# Patient Record
Sex: Female | Born: 1980 | Race: Black or African American | Hispanic: No | Smoking: Never smoker
Health system: Southern US, Community
[De-identification: ages and names within clinical notes are randomized; demographics above are authoritative.]

## PROBLEM LIST (undated history)

## (undated) DIAGNOSIS — R7611 Nonspecific reaction to tuberculin skin test without active tuberculosis: Secondary | ICD-10-CM

## (undated) DIAGNOSIS — K219 Gastro-esophageal reflux disease without esophagitis: Secondary | ICD-10-CM

## (undated) HISTORY — DX: Nonspecific reaction to tuberculin skin test without active tuberculosis: R76.11

## (undated) HISTORY — PX: THERAPEUTIC ABORTION: SHX798

---

## 2012-04-17 NOTE — L&D Delivery Note (Signed)
Delivery Note At 12:46 PM a viable female was delivered via Vaginal, Spontaneous Delivery (Presentation: Left Occiput Anterior).  APGAR: 8, 9; weight 6 lb 5.8 oz (2886 g).   Placenta status: Intact, Spontaneous.  Cord: 3 vessels with the following complications: None.    Anesthesia: Epidural  Episiotomy: None Lacerations: none Suture Repair: na Est. Blood Loss (mL): 350cc  Mom to postpartum.  Baby to Couplet care / Skin to Skin.  Called to room for pt who was c/c/+2 and pushing. HR had dropped to the 60s and was not coming up easily. Preserved scalp stim. She pushed both during and after contraction with good effort to deliver a liveborn female with spontaneous cry.  Baby placed on Mom for skin to skin. Delayed cord clamping performed and cord then cut, clamped and cut by FOB. Cord pH collected but not run by lab. Placenta delivered spontaneously intact with 3V cord. No tears or complications.  Mom to mother baby and baby on skin to skin.   Ettie Krontz L 03/03/2013, 1:11 PM

## 2012-07-16 DIAGNOSIS — R7611 Nonspecific reaction to tuberculin skin test without active tuberculosis: Secondary | ICD-10-CM

## 2012-07-16 HISTORY — DX: Nonspecific reaction to tuberculin skin test without active tuberculosis: R76.11

## 2013-01-16 ENCOUNTER — Encounter: Payer: Self-pay | Admitting: *Deleted

## 2013-01-20 ENCOUNTER — Ambulatory Visit (INDEPENDENT_AMBULATORY_CARE_PROVIDER_SITE_OTHER): Payer: Managed Care, Other (non HMO) | Admitting: Advanced Practice Midwife

## 2013-01-20 ENCOUNTER — Encounter: Payer: Self-pay | Admitting: Advanced Practice Midwife

## 2013-01-20 VITALS — BP 98/74 | Temp 98.2°F | Ht 66.0 in | Wt 187.0 lb

## 2013-01-20 DIAGNOSIS — O365990 Maternal care for other known or suspected poor fetal growth, unspecified trimester, not applicable or unspecified: Secondary | ICD-10-CM

## 2013-01-20 DIAGNOSIS — O26879 Cervical shortening, unspecified trimester: Secondary | ICD-10-CM

## 2013-01-20 DIAGNOSIS — O26873 Cervical shortening, third trimester: Secondary | ICD-10-CM | POA: Insufficient documentation

## 2013-01-20 DIAGNOSIS — IMO0002 Reserved for concepts with insufficient information to code with codable children: Secondary | ICD-10-CM

## 2013-01-20 LAB — POCT URINALYSIS DIP (DEVICE)
Glucose, UA: NEGATIVE mg/dL
Ketones, ur: NEGATIVE mg/dL
Nitrite: NEGATIVE
Protein, ur: NEGATIVE mg/dL
Urobilinogen, UA: 0.2 mg/dL (ref 0.0–1.0)

## 2013-01-20 NOTE — Progress Notes (Signed)
Pulse: 92

## 2013-01-23 ENCOUNTER — Telehealth: Payer: Self-pay | Admitting: *Deleted

## 2013-01-23 DIAGNOSIS — O365931 Maternal care for other known or suspected poor fetal growth, third trimester, fetus 1: Secondary | ICD-10-CM

## 2013-01-23 DIAGNOSIS — O365990 Maternal care for other known or suspected poor fetal growth, unspecified trimester, not applicable or unspecified: Secondary | ICD-10-CM | POA: Insufficient documentation

## 2013-01-23 NOTE — Telephone Encounter (Signed)
Called pt and informed her of need for follow up US to assess fetal growth and consult for plan of care @ [facility]aternal [facility]etal Care.  Pt voiced understanding and agreed to appt on 10/15 @ 1000. Directions given to [facility] dept.

## 2013-01-23 NOTE — Patient Instructions (Signed)

## 2013-01-23 NOTE — Progress Notes (Signed)
New OB transfer from [place].  Records reviewed.  Primary problems have been short cervix (was as low as 1.6cm then later up to 2.5cm) and New IUGR (5th %).  Will start twice weekly testing. Refer to MFM for [place] and recommendations

## 2013-01-23 NOTE — Telephone Encounter (Signed)
Message copied by Jill Side on Thu Jan 23, 2013  8:26 AM ------      Message from: [place], [place] L      Created: Thu Jan 23, 2013  4:59 AM      Regarding: need to start twice weekly testing       Just reviewed charts again. New IUGR at 28 wks (now 33 wks)      I put in a referral to MFM for [place] and recommendations            Can you call to get her appt asap?            Thanks      marie ------

## 2013-01-27 ENCOUNTER — Other Ambulatory Visit: Payer: Self-pay | Admitting: Obstetrics and Gynecology

## 2013-01-27 ENCOUNTER — Encounter: Payer: Self-pay | Admitting: Obstetrics & Gynecology

## 2013-01-27 DIAGNOSIS — O26879 Cervical shortening, unspecified trimester: Secondary | ICD-10-CM

## 2013-01-28 ENCOUNTER — Ambulatory Visit (HOSPITAL_COMMUNITY)
Admission: RE | Admit: 2013-01-28 | Discharge: 2013-01-28 | Disposition: A | Discharge status: Home / Self Care | Payer: Managed Care, Other (non HMO) | Source: Ambulatory Visit | Attending: Obstetrics and Gynecology | Admitting: Obstetrics and Gynecology

## 2013-01-28 ENCOUNTER — Other Ambulatory Visit: Payer: Self-pay | Admitting: Obstetrics and Gynecology

## 2013-01-28 ENCOUNTER — Encounter (HOSPITAL_COMMUNITY): Payer: Managed Care, Other (non HMO)

## 2013-01-28 ENCOUNTER — Other Ambulatory Visit (HOSPITAL_COMMUNITY): Payer: Managed Care, Other (non HMO)

## 2013-01-28 VITALS — BP 107/59 | HR 98 | Wt 185.0 lb

## 2013-01-28 DIAGNOSIS — O26879 Cervical shortening, unspecified trimester: Secondary | ICD-10-CM

## 2013-01-28 DIAGNOSIS — O365990 Maternal care for other known or suspected poor fetal growth, unspecified trimester, not applicable or unspecified: Secondary | ICD-10-CM | POA: Insufficient documentation

## 2013-01-28 DIAGNOSIS — IMO0002 Reserved for concepts with insufficient information to code with codable children: Secondary | ICD-10-CM

## 2013-01-28 DIAGNOSIS — O26873 Cervical shortening, third trimester: Secondary | ICD-10-CM

## 2013-01-28 NOTE — Consult Note (Addendum)
MFM Consult, Note:  I spoke to your patient, Kelly Krueger, in regard to "IUGR", vaginal bleeding history, and short cervix in pregnancy.  Currently, she has a SIUP at 34 3/7 wks complicated by 3 episodes of vaginal bleeding/spotting, lagging growth, and a short cervix.  Today's ultrasound demonstrates EFW is at the 23rd percentile with AC at the 5th percentile and symmetric growth pattern by HC/AC 1.02.  The UA Doppler S/D is near the mean for gestational age (normal).  AFI is gestational age appropriate.  I explained to her that these parameters are not consistent with a diagnosis of IUGR.  She reports that she had vaginal bleeding/spotting at 27, 29, and [redacted] weeks gestation.  She reports having at most passing a small clot, demonstrating with her hand an approximate 3cm diameter back at around [redacted] weeks gestation.  She has had no bleeding since.  Today's ultrasound demonstrates an anteriorly implanted placenta without evidence of previa.  She has no contractions and has no clinical indication today to warrant endovaginal imaging.  I recommend patient continue routine daily activities at home but that she present for evaluation in case of vaginal bleeding, uterine contractions, leakage of fluid or decreased fetal movement.  In my opinion, the bleeding history while resolved warrants continued antenatal testing with twice weekly NST, weekly AFI, monthly interval growth, and fetal kick counts.  Today's BPP of 8/8 is reassuring for fetal well-being.  Impressions: 1. Active SIUP with AGA growth by EFW and HC/AC 2. Antenatal vaginal bleeding resolved  Summary of Recommendations: Given the history of vaginal bleeding in this pregnancy, I recommend antenatal testing and fetal growth surveillance as follows:  1. twice weekly NST  2. weekly AFI 3. follow up growth in 3-4 weeks 4. fetal kick counts 5. preterm labor/vaginal bleeding precautions 6. No indication for tocolytics, antenatal steroids, or TVUS  for cervical length at this gestational age, noting I recommend serial pelvic examination if warranted by symptoms 7. Provided testing, interval growth, and maternal status remain reassuring, I would consider offering induction at 39-[redacted] weeks gestation for this patient.  Time Spent: I spent in excess of 40 minutes in consultation with this patient to review records, evaluate her case, and provide her with an adequate discussion and education.  More than 50% of this time was spent in direct face-to-face counseling. It was a pleasure seeing your patient in the office today.  Thank you for consultation. Please do not hesitate to contact our service for any further questions.   Level III  Olene Craven, Louann Sjogren, MD, MS, FACOG Assistant Professor Section of Maternal-Fetal Medicine [facility]

## 2013-01-29 ENCOUNTER — Encounter (HOSPITAL_COMMUNITY): Payer: Managed Care, Other (non HMO)

## 2013-01-29 ENCOUNTER — Other Ambulatory Visit (HOSPITAL_COMMUNITY): Payer: Managed Care, Other (non HMO)

## 2013-01-30 ENCOUNTER — Other Ambulatory Visit: Payer: Self-pay | Admitting: Obstetrics and Gynecology

## 2013-01-30 DIAGNOSIS — O365931 Maternal care for other known or suspected poor fetal growth, third trimester, fetus 1: Secondary | ICD-10-CM

## 2013-01-31 ENCOUNTER — Ambulatory Visit (HOSPITAL_COMMUNITY)
Admission: RE | Admit: 2013-01-31 | Discharge: 2013-01-31 | Disposition: A | Discharge status: Home / Self Care | Payer: Managed Care, Other (non HMO) | Source: Ambulatory Visit | Attending: Obstetrics and Gynecology | Admitting: Obstetrics and Gynecology

## 2013-01-31 VITALS — BP 116/73 | Wt 187.0 lb

## 2013-01-31 DIAGNOSIS — O26879 Cervical shortening, unspecified trimester: Secondary | ICD-10-CM | POA: Insufficient documentation

## 2013-01-31 DIAGNOSIS — O365990 Maternal care for other known or suspected poor fetal growth, unspecified trimester, not applicable or unspecified: Secondary | ICD-10-CM | POA: Insufficient documentation

## 2013-01-31 DIAGNOSIS — O26873 Cervical shortening, third trimester: Secondary | ICD-10-CM

## 2013-02-03 ENCOUNTER — Ambulatory Visit (INDEPENDENT_AMBULATORY_CARE_PROVIDER_SITE_OTHER): Payer: Managed Care, Other (non HMO) | Admitting: Obstetrics and Gynecology

## 2013-02-03 VITALS — BP 107/71 | Temp 97.1°F | Wt 187.2 lb

## 2013-02-03 DIAGNOSIS — O4690 Antepartum hemorrhage, unspecified, unspecified trimester: Secondary | ICD-10-CM | POA: Insufficient documentation

## 2013-02-03 DIAGNOSIS — O26873 Cervical shortening, third trimester: Secondary | ICD-10-CM

## 2013-02-03 DIAGNOSIS — O4693 Antepartum hemorrhage, unspecified, third trimester: Secondary | ICD-10-CM

## 2013-02-03 LAB — POCT URINALYSIS DIP (DEVICE)
Ketones, ur: NEGATIVE mg/dL
Nitrite: NEGATIVE
Protein, ur: NEGATIVE mg/dL
Urobilinogen, UA: 0.2 mg/dL (ref 0.0–1.0)
pH: 6.5 (ref 5.0–8.0)

## 2013-02-03 NOTE — Progress Notes (Signed)
Reviewed MFM scan> 23rd, sym, normal. 2x/wk FATs (last episode over 1 month ago). Kick counts

## 2013-02-03 NOTE — Patient Instructions (Signed)
Fetal Movement Counts Patient Name: __________________________________________________ Patient Due Date: ____________________ Performing a fetal movement count is highly recommended in high-risk pregnancies, but it is good for every pregnant woman to do. Your caregiver may ask you to start counting fetal movements at 28 weeks of the pregnancy. Fetal movements often increase:  After eating a full meal.  After physical activity.  After eating or drinking something sweet or cold.  At rest. Pay attention to when you feel the baby is most active. This will help you notice a pattern of your baby's sleep and wake cycles and what factors contribute to an increase in fetal movement. It is important to perform a fetal movement count at the same time each day when your baby is normally most active.  HOW TO COUNT FETAL MOVEMENTS 1. Find a quiet and comfortable area to sit or lie down on your left side. Lying on your left side provides the best blood and oxygen circulation to your baby. 2. Write down the day and time on a sheet of paper or in a journal. 3. Start counting kicks, flutters, swishes, rolls, or jabs in a 2 hour period. You should feel at least 10 movements within 2 hours. 4. If you do not feel 10 movements in 2 hours, wait 2 3 hours and count again. Look for a change in the pattern or not enough counts in 2 hours. SEEK MEDICAL CARE IF:  You feel less than 10 counts in 2 hours, tried twice.  There is no movement in over an hour.  The pattern is changing or taking longer each day to reach 10 counts in 2 hours.  You feel the baby is not moving as he or she usually does. Date: ____________ Movements: ____________ Start time: ____________ Finish time: ____________  Date: ____________ Movements: ____________ Start time: ____________ Finish time: ____________ Date: ____________ Movements: ____________ Start time: ____________ Finish time: ____________ Date: ____________ Movements: ____________  Start time: ____________ Finish time: ____________ Date: ____________ Movements: ____________ Start time: ____________ Finish time: ____________ Date: ____________ Movements: ____________ Start time: ____________ Finish time: ____________ Date: ____________ Movements: ____________ Start time: ____________ Finish time: ____________ Date: ____________ Movements: ____________ Start time: ____________ Finish time: ____________  Date: ____________ Movements: ____________ Start time: ____________ Finish time: ____________ Date: ____________ Movements: ____________ Start time: ____________ Finish time: ____________ Date: ____________ Movements: ____________ Start time: ____________ Finish time: ____________ Date: ____________ Movements: ____________ Start time: ____________ Finish time: ____________ Date: ____________ Movements: ____________ Start time: ____________ Finish time: ____________ Date: ____________ Movements: ____________ Start time: ____________ Finish time: ____________ Date: ____________ Movements: ____________ Start time: ____________ Finish time: ____________  Date: ____________ Movements: ____________ Start time: ____________ Finish time: ____________ Date: ____________ Movements: ____________ Start time: ____________ Finish time: ____________ Date: ____________ Movements: ____________ Start time: ____________ Finish time: ____________ Date: ____________ Movements: ____________ Start time: ____________ Finish time: ____________ Date: ____________ Movements: ____________ Start time: ____________ Finish time: ____________ Date: ____________ Movements: ____________ Start time: ____________ Finish time: ____________ Date: ____________ Movements: ____________ Start time: ____________ Finish time: ____________  Date: ____________ Movements: ____________ Start time: ____________ Finish time: ____________ Date: ____________ Movements: ____________ Start time: ____________ Finish time:  ____________ Date: ____________ Movements: ____________ Start time: ____________ Finish time: ____________ Date: ____________ Movements: ____________ Start time: ____________ Finish time: ____________ Date: ____________ Movements: ____________ Start time: ____________ Finish time: ____________ Date: ____________ Movements: ____________ Start time: ____________ Finish time: ____________ Date: ____________ Movements: ____________ Start time: ____________ Finish time: ____________  Date: ____________ Movements: ____________ Start time: ____________ Finish   time: ____________ Date: ____________ Movements: ____________ Start time: ____________ Finish time: ____________ Date: ____________ Movements: ____________ Start time: ____________ Finish time: ____________ Date: ____________ Movements: ____________ Start time: ____________ Finish time: ____________ Date: ____________ Movements: ____________ Start time: ____________ Finish time: ____________ Date: ____________ Movements: ____________ Start time: ____________ Finish time: ____________ Date: ____________ Movements: ____________ Start time: ____________ Finish time: ____________  Date: ____________ Movements: ____________ Start time: ____________ Finish time: ____________ Date: ____________ Movements: ____________ Start time: ____________ Finish time: ____________ Date: ____________ Movements: ____________ Start time: ____________ Finish time: ____________ Date: ____________ Movements: ____________ Start time: ____________ Finish time: ____________ Date: ____________ Movements: ____________ Start time: ____________ Finish time: ____________ Date: ____________ Movements: ____________ Start time: ____________ Finish time: ____________ Date: ____________ Movements: ____________ Start time: ____________ Finish time: ____________  Date: ____________ Movements: ____________ Start time: ____________ Finish time: ____________ Date: ____________ Movements:  ____________ Start time: ____________ Finish time: ____________ Date: ____________ Movements: ____________ Start time: ____________ Finish time: ____________ Date: ____________ Movements: ____________ Start time: ____________ Finish time: ____________ Date: ____________ Movements: ____________ Start time: ____________ Finish time: ____________ Date: ____________ Movements: ____________ Start time: ____________ Finish time: ____________ Date: ____________ Movements: ____________ Start time: ____________ Finish time: ____________  Date: ____________ Movements: ____________ Start time: ____________ Finish time: ____________ Date: ____________ Movements: ____________ Start time: ____________ Finish time: ____________ Date: ____________ Movements: ____________ Start time: ____________ Finish time: ____________ Date: ____________ Movements: ____________ Start time: ____________ Finish time: ____________ Date: ____________ Movements: ____________ Start time: ____________ Finish time: ____________ Date: ____________ Movements: ____________ Start time: ____________ Finish time: ____________ Document Released: 05/03/2006 Document Revised: 03/20/2012 Document Reviewed: 01/29/2012 [employer] Patient Information 2014 [employer]  

## 2013-02-03 NOTE — Progress Notes (Signed)
Pulse- 98 Patient reports pain in LLQ

## 2013-02-04 ENCOUNTER — Other Ambulatory Visit: Payer: Self-pay | Admitting: Obstetrics and Gynecology

## 2013-02-04 ENCOUNTER — Ambulatory Visit (HOSPITAL_COMMUNITY)
Admission: RE | Admit: 2013-02-04 | Discharge: 2013-02-04 | Disposition: A | Discharge status: Home / Self Care | Payer: Managed Care, Other (non HMO) | Source: Ambulatory Visit | Attending: Obstetrics and Gynecology | Admitting: Obstetrics and Gynecology

## 2013-02-04 VITALS — BP 112/65 | HR 97 | Wt 188.5 lb

## 2013-02-04 DIAGNOSIS — O26873 Cervical shortening, third trimester: Secondary | ICD-10-CM

## 2013-02-04 DIAGNOSIS — O365931 Maternal care for other known or suspected poor fetal growth, third trimester, fetus 1: Secondary | ICD-10-CM

## 2013-02-04 DIAGNOSIS — O26879 Cervical shortening, unspecified trimester: Secondary | ICD-10-CM | POA: Insufficient documentation

## 2013-02-04 DIAGNOSIS — O289 Unspecified abnormal findings on antenatal screening of mother: Secondary | ICD-10-CM | POA: Insufficient documentation

## 2013-02-04 DIAGNOSIS — O365990 Maternal care for other known or suspected poor fetal growth, unspecified trimester, not applicable or unspecified: Secondary | ICD-10-CM | POA: Insufficient documentation

## 2013-02-04 NOTE — Progress Notes (Signed)
Kelly Krueger was seen for ultrasound appointment today.  Please see [facility] report for details.

## 2013-02-07 ENCOUNTER — Ambulatory Visit (HOSPITAL_COMMUNITY)
Admission: RE | Admit: 2013-02-07 | Discharge: 2013-02-07 | Disposition: A | Discharge status: Home / Self Care | Payer: Managed Care, Other (non HMO) | Source: Ambulatory Visit | Attending: Obstetrics & Gynecology | Admitting: Obstetrics & Gynecology

## 2013-02-07 ENCOUNTER — Other Ambulatory Visit: Payer: Self-pay | Admitting: Obstetrics & Gynecology

## 2013-02-07 ENCOUNTER — Ambulatory Visit (INDEPENDENT_AMBULATORY_CARE_PROVIDER_SITE_OTHER): Payer: Managed Care, Other (non HMO) | Admitting: *Deleted

## 2013-02-07 ENCOUNTER — Other Ambulatory Visit (HOSPITAL_COMMUNITY): Payer: Managed Care, Other (non HMO)

## 2013-02-07 VITALS — BP 98/67 | Temp 98.1°F | Wt 185.5 lb

## 2013-02-07 DIAGNOSIS — O289 Unspecified abnormal findings on antenatal screening of mother: Secondary | ICD-10-CM

## 2013-02-07 DIAGNOSIS — O288 Other abnormal findings on antenatal screening of mother: Secondary | ICD-10-CM

## 2013-02-07 DIAGNOSIS — O365930 Maternal care for other known or suspected poor fetal growth, third trimester, not applicable or unspecified: Secondary | ICD-10-CM

## 2013-02-07 DIAGNOSIS — O365990 Maternal care for other known or suspected poor fetal growth, unspecified trimester, not applicable or unspecified: Secondary | ICD-10-CM

## 2013-02-07 NOTE — Progress Notes (Signed)
P - 74 

## 2013-02-10 ENCOUNTER — Encounter: Payer: Self-pay | Admitting: Obstetrics and Gynecology

## 2013-02-10 ENCOUNTER — Ambulatory Visit (INDEPENDENT_AMBULATORY_CARE_PROVIDER_SITE_OTHER): Payer: Managed Care, Other (non HMO) | Admitting: Obstetrics and Gynecology

## 2013-02-10 ENCOUNTER — Encounter: Payer: Managed Care, Other (non HMO) | Admitting: Obstetrics and Gynecology

## 2013-02-10 VITALS — BP 107/75 | Wt 188.2 lb

## 2013-02-10 DIAGNOSIS — O26879 Cervical shortening, unspecified trimester: Secondary | ICD-10-CM

## 2013-02-10 DIAGNOSIS — O365990 Maternal care for other known or suspected poor fetal growth, unspecified trimester, not applicable or unspecified: Secondary | ICD-10-CM

## 2013-02-10 DIAGNOSIS — O26873 Cervical shortening, third trimester: Secondary | ICD-10-CM

## 2013-02-10 DIAGNOSIS — O365931 Maternal care for other known or suspected poor fetal growth, third trimester, fetus 1: Secondary | ICD-10-CM

## 2013-02-10 DIAGNOSIS — O365930 Maternal care for other known or suspected poor fetal growth, third trimester, not applicable or unspecified: Secondary | ICD-10-CM

## 2013-02-10 LAB — OB RESULTS CONSOLE GC/CHLAMYDIA
Chlamydia: NEGATIVE
Gonorrhea: NEGATIVE

## 2013-02-10 LAB — OB RESULTS CONSOLE GBS: GBS: POSITIVE

## 2013-02-10 LAB — POCT URINALYSIS DIP (DEVICE)
Bilirubin Urine: NEGATIVE
Glucose, UA: NEGATIVE mg/dL
Nitrite: NEGATIVE
Protein, ur: NEGATIVE mg/dL
Urobilinogen, UA: 0.2 mg/dL (ref 0.0–1.0)
pH: 6 (ref 5.0–8.0)

## 2013-02-10 NOTE — Progress Notes (Signed)
P-120 

## 2013-02-10 NOTE — Progress Notes (Signed)
NST reviewed and reactive. FM/labor precautions reviewed. Cultures today. Will schedule follow up growth ultrasound for next week.

## 2013-02-11 ENCOUNTER — Ambulatory Visit (HOSPITAL_COMMUNITY): Payer: Managed Care, Other (non HMO)

## 2013-02-11 LAB — GC/CHLAMYDIA PROBE AMP: GC Probe RNA: NEGATIVE

## 2013-02-11 NOTE — Progress Notes (Signed)
Reactive NST 02/07/13

## 2013-02-14 ENCOUNTER — Ambulatory Visit (INDEPENDENT_AMBULATORY_CARE_PROVIDER_SITE_OTHER): Payer: Managed Care, Other (non HMO) | Admitting: *Deleted

## 2013-02-14 ENCOUNTER — Encounter: Payer: Self-pay | Admitting: Obstetrics and Gynecology

## 2013-02-14 ENCOUNTER — Ambulatory Visit (HOSPITAL_COMMUNITY): Payer: Managed Care, Other (non HMO)

## 2013-02-14 VITALS — BP 98/67

## 2013-02-14 DIAGNOSIS — O365931 Maternal care for other known or suspected poor fetal growth, third trimester, fetus 1: Secondary | ICD-10-CM

## 2013-02-14 DIAGNOSIS — O365990 Maternal care for other known or suspected poor fetal growth, unspecified trimester, not applicable or unspecified: Secondary | ICD-10-CM

## 2013-02-14 NOTE — Progress Notes (Signed)
NST performed today was reviewed and was found to be reactive.  Continue recommended antenatal testing and prenatal care.  

## 2013-02-14 NOTE — Progress Notes (Signed)
P - 104 

## 2013-02-15 ENCOUNTER — Encounter: Payer: Self-pay | Admitting: Obstetrics and Gynecology

## 2013-02-15 DIAGNOSIS — O99820 Streptococcus B carrier state complicating pregnancy: Secondary | ICD-10-CM | POA: Insufficient documentation

## 2013-02-17 ENCOUNTER — Ambulatory Visit (INDEPENDENT_AMBULATORY_CARE_PROVIDER_SITE_OTHER): Payer: Managed Care, Other (non HMO) | Admitting: Obstetrics & Gynecology

## 2013-02-17 VITALS — BP 103/72 | Wt 190.7 lb

## 2013-02-17 DIAGNOSIS — O99820 Streptococcus B carrier state complicating pregnancy: Secondary | ICD-10-CM

## 2013-02-17 DIAGNOSIS — O365931 Maternal care for other known or suspected poor fetal growth, third trimester, fetus 1: Secondary | ICD-10-CM

## 2013-02-17 DIAGNOSIS — O26879 Cervical shortening, unspecified trimester: Secondary | ICD-10-CM

## 2013-02-17 DIAGNOSIS — O26873 Cervical shortening, third trimester: Secondary | ICD-10-CM

## 2013-02-17 DIAGNOSIS — O09899 Supervision of other high risk pregnancies, unspecified trimester: Secondary | ICD-10-CM

## 2013-02-17 DIAGNOSIS — O365990 Maternal care for other known or suspected poor fetal growth, unspecified trimester, not applicable or unspecified: Secondary | ICD-10-CM

## 2013-02-17 DIAGNOSIS — Z22330 Carrier of Group B streptococcus: Secondary | ICD-10-CM

## 2013-02-17 LAB — FETAL NONSTRESS TEST

## 2013-02-17 LAB — POCT URINALYSIS DIP (DEVICE)
Bilirubin Urine: NEGATIVE
Glucose, UA: NEGATIVE mg/dL
Ketones, ur: NEGATIVE mg/dL
Specific Gravity, Urine: 1.02 (ref 1.005–1.030)
pH: 6.5 (ref 5.0–8.0)

## 2013-02-17 NOTE — Patient Instructions (Signed)
Return to clinic for any obstetric concerns or go to [facility] for evaluation  

## 2013-02-17 NOTE — Progress Notes (Signed)
[place] for growth scheduled on 11/7

## 2013-02-17 NOTE — Progress Notes (Signed)
Will follow up growth scan.  NST performed today was reviewed and was found to be reactive.  Continue recommended antenatal testing and prenatal care. No other complaints or concerns.  Fetal movement and labor precautions reviewed.

## 2013-02-17 NOTE — Progress Notes (Signed)
Pulse: 90

## 2013-02-20 ENCOUNTER — Other Ambulatory Visit: Payer: Self-pay

## 2013-02-21 ENCOUNTER — Other Ambulatory Visit: Payer: Managed Care, Other (non HMO)

## 2013-02-21 ENCOUNTER — Ambulatory Visit (HOSPITAL_COMMUNITY)
Admission: RE | Admit: 2013-02-21 | Discharge: 2013-02-21 | Disposition: A | Discharge status: Home / Self Care | Payer: Managed Care, Other (non HMO) | Source: Ambulatory Visit | Attending: Obstetrics and Gynecology | Admitting: Obstetrics and Gynecology

## 2013-02-21 ENCOUNTER — Ambulatory Visit (HOSPITAL_COMMUNITY): Admission: RE | Admit: 2013-02-21 | Payer: Managed Care, Other (non HMO) | Source: Ambulatory Visit

## 2013-02-21 DIAGNOSIS — O26879 Cervical shortening, unspecified trimester: Secondary | ICD-10-CM | POA: Insufficient documentation

## 2013-02-21 DIAGNOSIS — O365990 Maternal care for other known or suspected poor fetal growth, unspecified trimester, not applicable or unspecified: Secondary | ICD-10-CM | POA: Insufficient documentation

## 2013-02-21 DIAGNOSIS — O26873 Cervical shortening, third trimester: Secondary | ICD-10-CM

## 2013-02-24 ENCOUNTER — Encounter: Payer: Self-pay | Admitting: Family Medicine

## 2013-02-24 ENCOUNTER — Ambulatory Visit (INDEPENDENT_AMBULATORY_CARE_PROVIDER_SITE_OTHER): Payer: Managed Care, Other (non HMO) | Admitting: Family Medicine

## 2013-02-24 VITALS — BP 108/77 | Wt 190.3 lb

## 2013-02-24 DIAGNOSIS — O365931 Maternal care for other known or suspected poor fetal growth, third trimester, fetus 1: Secondary | ICD-10-CM

## 2013-02-24 DIAGNOSIS — Z22330 Carrier of Group B streptococcus: Secondary | ICD-10-CM

## 2013-02-24 DIAGNOSIS — O99820 Streptococcus B carrier state complicating pregnancy: Secondary | ICD-10-CM

## 2013-02-24 DIAGNOSIS — O26873 Cervical shortening, third trimester: Secondary | ICD-10-CM

## 2013-02-24 DIAGNOSIS — O09899 Supervision of other high risk pregnancies, unspecified trimester: Secondary | ICD-10-CM

## 2013-02-24 DIAGNOSIS — N83202 Unspecified ovarian cyst, left side: Secondary | ICD-10-CM | POA: Insufficient documentation

## 2013-02-24 DIAGNOSIS — N83209 Unspecified ovarian cyst, unspecified side: Secondary | ICD-10-CM

## 2013-02-24 DIAGNOSIS — O365990 Maternal care for other known or suspected poor fetal growth, unspecified trimester, not applicable or unspecified: Secondary | ICD-10-CM

## 2013-02-24 DIAGNOSIS — O26879 Cervical shortening, unspecified trimester: Secondary | ICD-10-CM

## 2013-02-24 DIAGNOSIS — R7611 Nonspecific reaction to tuberculin skin test without active tuberculosis: Secondary | ICD-10-CM | POA: Insufficient documentation

## 2013-02-24 LAB — POCT URINALYSIS DIP (DEVICE)
Glucose, UA: NEGATIVE mg/dL
Ketones, ur: NEGATIVE mg/dL
Protein, ur: NEGATIVE mg/dL
Specific Gravity, Urine: 1.02 (ref 1.005–1.030)
pH: 7 (ref 5.0–8.0)

## 2013-02-24 NOTE — Patient Instructions (Addendum)
Third Trimester of Pregnancy The third trimester is from week 29 through week 42, months 7 through 9. The third trimester is a time when the fetus is growing rapidly. At the end of the ninth month, the fetus is about 20 inches in length and weighs 6 10 pounds.  BODY CHANGES Your body goes through many changes during pregnancy. The changes vary from woman to woman.   Your weight will continue to increase. You can expect to gain 25 35 pounds (11 16 kg) by the end of the pregnancy.  You may begin to get stretch marks on your hips, abdomen, and breasts.  You may urinate more often because the fetus is moving lower into your pelvis and pressing on your bladder.  You may develop or continue to have heartburn as a result of your pregnancy.  You may develop constipation because certain hormones are causing the muscles that push waste through your intestines to slow down.  You may develop hemorrhoids or swollen, bulging veins (varicose veins).  You may have pelvic pain because of the weight gain and pregnancy hormones relaxing your joints between the bones in your pelvis. Back aches may result from over exertion of the muscles supporting your posture.  Your breasts will continue to grow and be tender. A yellow discharge may leak from your breasts called colostrum.  Your belly button may stick out.  You may feel short of breath because of your expanding uterus.  You may notice the fetus "dropping," or moving lower in your abdomen.  You may have a bloody mucus discharge. This usually occurs a few days to a week before labor begins.  Your cervix becomes thin and soft (effaced) near your due date. WHAT TO EXPECT AT YOUR PRENATAL EXAMS  You will have prenatal exams every 2 weeks until week 36. Then, you will have weekly prenatal exams. During a routine prenatal visit:  You will be weighed to make sure you and the fetus are growing normally.  Your blood pressure is taken.  Your abdomen will  be measured to track your baby's growth.  The fetal heartbeat will be listened to.  Any test results from the previous visit will be discussed.  You may have a cervical check near your due date to see if you have effaced. At around 36 weeks, your caregiver will check your cervix. At the same time, your caregiver will also perform a test on the secretions of the vaginal tissue. This test is to determine if a type of bacteria, Group B streptococcus, is present. Your caregiver will explain this further. Your caregiver may ask you:  What your birth plan is.  How you are feeling.  If you are feeling the baby move.  If you have had any abnormal symptoms, such as leaking fluid, bleeding, severe headaches, or abdominal cramping.  If you have any questions. Other tests or screenings that may be performed during your third trimester include:  Blood tests that check for low iron levels (anemia).  Fetal testing to check the health, activity level, and growth of the fetus. Testing is done if you have certain medical conditions or if there are problems during the pregnancy. FALSE LABOR You may feel small, irregular contractions that eventually go away. These are called Braxton Hicks contractions, or false labor. Contractions may last for hours, days, or even weeks before true labor sets in. If contractions come at regular intervals, intensify, or become painful, it is best to be seen by your caregiver.    SIGNS OF LABOR   Menstrual-like cramps.  Contractions that are 5 minutes apart or less.  Contractions that start on the top of the uterus and spread down to the lower abdomen and back.  A sense of increased pelvic pressure or back pain.  A watery or bloody mucus discharge that comes from the vagina. If you have any of these signs before the 37th week of pregnancy, call your caregiver right away. You need to go to the hospital to get checked immediately. HOME CARE INSTRUCTIONS   Avoid all  smoking, herbs, alcohol, and unprescribed drugs. These chemicals affect the formation and growth of the baby.  Follow your caregiver's instructions regarding medicine use. There are medicines that are either safe or unsafe to take during pregnancy.  Exercise only as directed by your caregiver. Experiencing uterine cramps is a good sign to stop exercising.  Continue to eat regular, healthy meals.  Wear a good support bra for breast tenderness.  Do not use hot tubs, steam rooms, or saunas.  Wear your seat belt at all times when driving.  Avoid raw meat, uncooked cheese, cat litter boxes, and soil used by cats. These carry germs that can cause birth defects in the baby.  Take your prenatal vitamins.  Try taking a stool softener (if your caregiver approves) if you develop constipation. Eat more high-fiber foods, such as fresh vegetables or fruit and whole grains. Drink plenty of fluids to keep your urine clear or pale yellow.  Take warm sitz baths to soothe any pain or discomfort caused by hemorrhoids. Use hemorrhoid cream if your caregiver approves.  If you develop varicose veins, wear support hose. Elevate your feet for 15 minutes, 3 4 times a day. Limit salt in your diet.  Avoid heavy lifting, wear low heal shoes, and practice good posture.  Rest a lot with your legs elevated if you have leg cramps or low back pain.  Visit your dentist if you have not gone during your pregnancy. Use a soft toothbrush to brush your teeth and be gentle when you floss.  A sexual relationship may be continued unless your caregiver directs you otherwise.  Do not travel far distances unless it is absolutely necessary and only with the approval of your caregiver.  Take prenatal classes to understand, practice, and ask questions about the labor and delivery.  Make a trial run to the hospital.  Pack your hospital bag.  Prepare the baby's nursery.  Continue to go to all your prenatal visits as directed  by your caregiver. SEEK MEDICAL CARE IF:  You are unsure if you are in labor or if your water has broken.  You have dizziness.  You have mild pelvic cramps, pelvic pressure, or nagging pain in your abdominal area.  You have persistent nausea, vomiting, or diarrhea.  You have a bad smelling vaginal discharge.  You have pain with urination. SEEK IMMEDIATE MEDICAL CARE IF:   You have a fever.  You are leaking fluid from your vagina.  You have spotting or bleeding from your vagina.  You have severe abdominal cramping or pain.  You have rapid weight loss or gain.  You have shortness of breath with chest pain.  You notice sudden or extreme swelling of your face, hands, ankles, feet, or legs.  You have not felt your baby move in over an hour.  You have severe headaches that do not go away with medicine.  You have vision changes. Document Released: 03/28/2001 Document Revised: 12/04/2012 Document Reviewed:   06/04/2012 [employer] Patient Information 2014 [employer].  Breastfeeding Deciding to breastfeed is one of the best choices you can make for you and your baby. A change in hormones during pregnancy causes your breast tissue to grow and increases the number and size of your milk ducts. These hormones also allow proteins, sugars, and fats from your blood supply to make breast milk in your milk-producing glands. Hormones prevent breast milk from being released before your baby is born as well as prompt milk flow after birth. Once breastfeeding has begun, thoughts of your baby, as well as his or her sucking or crying, can stimulate the release of milk from your milk-producing glands.  BENEFITS OF BREASTFEEDING For Your Baby  Your first milk (colostrum) helps your baby's digestive system function better.   There are antibodies in your milk that help your baby fight off infections.   Your baby has a lower incidence of asthma, allergies, and sudden infant death syndrome.    The nutrients in breast milk are better for your baby than infant formulas and are designed uniquely for your baby's needs.   Breast milk improves your baby's brain development.   Your baby is less likely to develop other conditions, such as childhood obesity, asthma, or type 2 diabetes mellitus.  For You   Breastfeeding helps to create a very special bond between you and your baby.   Breastfeeding is convenient. Breast milk is always available at the correct temperature and costs nothing.   Breastfeeding helps to burn calories and helps you lose the weight gained during pregnancy.   Breastfeeding makes your uterus contract to its prepregnancy size faster and slows bleeding (lochia) after you give birth.   Breastfeeding helps to lower your risk of developing type 2 diabetes mellitus, osteoporosis, and breast or ovarian cancer later in life. SIGNS THAT YOUR BABY IS HUNGRY Early Signs of Hunger  Increased alertness or activity.  Stretching.  Movement of the head from side to side.  Movement of the head and opening of the mouth when the corner of the mouth or cheek is stroked (rooting).  Increased sucking sounds, smacking lips, cooing, sighing, or squeaking.  Hand-to-mouth movements.  Increased sucking of fingers or hands. Late Signs of Hunger  Fussing.  Intermittent crying. Extreme Signs of Hunger Signs of extreme hunger will require calming and consoling before your baby will be able to breastfeed successfully. Do not wait for the following signs of extreme hunger to occur before you initiate breastfeeding:   Restlessness.  A loud, strong cry.   Screaming. BREASTFEEDING BASICS Breastfeeding Initiation  Find a comfortable place to sit or lie down, with your neck and back well supported.  Place a pillow or rolled up blanket under your baby to bring him or her to the level of your breast (if you are seated). Nursing pillows are specially designed to help  support your arms and your baby while you breastfeed.  Make sure that your baby's abdomen is facing your abdomen.   Gently massage your breast. With your fingertips, massage from your chest wall toward your nipple in a circular motion. This encourages milk flow. You may need to continue this action during the feeding if your milk flows slowly.  Support your breast with 4 fingers underneath and your thumb above your nipple. Make sure your fingers are well away from your nipple and your baby's mouth.   Stroke your baby's lips gently with your finger or nipple.   When your baby's mouth is   open wide enough, quickly bring your baby to your breast, placing your entire nipple and as much of the colored area around your nipple (areola) as possible into your baby's mouth.   More areola should be visible above your baby's upper lip than below the lower lip.   Your baby's tongue should be between his or her lower gum and your breast.   Ensure that your baby's mouth is correctly positioned around your nipple (latched). Your baby's lips should create a seal on your breast and be turned out (everted).  It is common for your baby to suck about 2 3 minutes in order to start the flow of breast milk. Latching Teaching your baby how to latch on to your breast properly is very important. An improper latch can cause nipple pain and decreased milk supply for you and poor weight gain in your baby. Also, if your baby is not latched onto your nipple properly, he or she may swallow some air during feeding. This can make your baby fussy. Burping your baby when you switch breasts during the feeding can help to get rid of the air. However, teaching your baby to latch on properly is still the best way to prevent fussiness from swallowing air while breastfeeding. Signs that your baby has successfully latched on to your nipple:    Silent tugging or silent sucking, without causing you pain.   Swallowing heard  between every 3 4 sucks.    Muscle movement above and in front of his or her ears while sucking.  Signs that your baby has not successfully latched on to nipple:   Sucking sounds or smacking sounds from your baby while breastfeeding.  Nipple pain. If you think your baby has not latched on correctly, slip your finger into the corner of your baby's mouth to break the suction and place it between your baby's gums. Attempt breastfeeding initiation again. Signs of Successful Breastfeeding Signs from your baby:   A gradual decrease in the number of sucks or complete cessation of sucking.   Falling asleep.   Relaxation of his or her body.   Retention of a small amount of milk in his or her mouth.   Letting go of your breast by himself or herself. Signs from you:  Breasts that have increased in firmness, weight, and size 1 3 hours after feeding.   Breasts that are softer immediately after breastfeeding.  Increased milk volume, as well as a change in milk consistency and color by the 5th day of breastfeeding.   Nipples that are not sore, cracked, or bleeding. Signs That Your Baby is Getting Enough Milk  Wetting at least 3 diapers in a 24-hour period. The urine should be clear and pale yellow by age 5 days.  At least 3 stools in a 24-hour period by age 5 days. The stool should be soft and yellow.  At least 3 stools in a 24-hour period by age 7 days. The stool should be seedy and yellow.  No loss of weight greater than 10% of birth weight during the first 3 days of age.  Average weight gain of 4 7 ounces (120 210 mL) per week after age 4 days.  Consistent daily weight gain by age 5 days, without weight loss after the age of 2 weeks. After a feeding, your baby may spit up a small amount. This is common. BREASTFEEDING FREQUENCY AND DURATION Frequent feeding will help you make more milk and can prevent sore nipples and breast engorgement.   Breastfeed when you feel the need to  reduce the fullness of your breasts or when your baby shows signs of hunger. This is called "breastfeeding on demand." Avoid introducing a pacifier to your baby while you are working to establish breastfeeding (the first 4 6 weeks after your baby is born). After this time you may choose to use a pacifier. Research has shown that pacifier use during the first year of a baby's life decreases the risk of sudden infant death syndrome (SIDS). Allow your baby to feed on each breast as long as he or she wants. Breastfeed until your baby is finished feeding. When your baby unlatches or falls asleep while feeding from the first breast, offer the second breast. Because newborns are often sleepy in the first few weeks of life, you may need to awaken your baby to get him or her to feed. Breastfeeding times will vary from baby to baby. However, the following rules can serve as a guide to help you ensure that your baby is properly fed:  Newborns (babies 4 weeks of age or younger) may breastfeed every 1 3 hours.  Newborns should not go longer than 3 hours during the day or 5 hours during the night without breastfeeding.  You should breastfeed your baby a minimum of 8 times in a 24-hour period until you begin to introduce solid foods to your baby at around 6 months of age. BREAST MILK PUMPING Pumping and storing breast milk allows you to ensure that your baby is exclusively fed your breast milk, even at times when you are unable to breastfeed. This is especially important if you are going back to work while you are still breastfeeding or when you are not able to be present during feedings. Your lactation consultant can give you guidelines on how long it is safe to store breast milk.  A breast pump is a machine that allows you to pump milk from your breast into a sterile bottle. The pumped breast milk can then be stored in a refrigerator or freezer. Some breast pumps are operated by hand, while others use electricity. Ask  your lactation consultant which type will work best for you. Breast pumps can be purchased, but some hospitals and breastfeeding support groups lease breast pumps on a monthly basis. A lactation consultant can teach you how to hand express breast milk, if you prefer not to use a pump.  CARING FOR YOUR BREASTS WHILE YOU BREASTFEED Nipples can become dry, cracked, and sore while breastfeeding. The following recommendations can help keep your breasts moisturized and healthy:  Avoid using soap on your nipples.   Wear a supportive bra. Although not required, special nursing bras and tank tops are designed to allow access to your breasts for breastfeeding without taking off your entire bra or top. Avoid wearing underwire style bras or extremely tight bras.  Air dry your nipples for 3 4minutes after each feeding.   Use only cotton bra pads to absorb leaked breast milk. Leaking of breast milk between feedings is normal.   Use lanolin on your nipples after breastfeeding. Lanolin helps to maintain your skin's normal moisture barrier. If you use pure lanolin you do not need to wash it off before feeding your baby again. Pure lanolin is not toxic to your baby. You may also hand express a few drops of breast milk and gently massage that milk into your nipples and allow the milk to air dry. In the first few weeks after giving birth, some women   experience extremely full breasts (engorgement). Engorgement can make your breasts feel heavy, warm, and tender to the touch. Engorgement peaks within 3 5 days after you give birth. The following recommendations can help ease engorgement:  Completely empty your breasts while breastfeeding or pumping. You may want to start by applying warm, moist heat (in the shower or with warm water-soaked hand towels) just before feeding or pumping. This increases circulation and helps the milk flow. If your baby does not completely empty your breasts while breastfeeding, pump any extra  milk after he or she is finished.  Wear a snug bra (nursing or regular) or tank top for 1 2 days to signal your body to slightly decrease milk production.  Apply ice packs to your breasts, unless this is too uncomfortable for you.  Make sure that your baby is latched on and positioned properly while breastfeeding. If engorgement persists after 48 hours of following these recommendations, contact your health care provider or a Advertising copywriter. OVERALL HEALTH CARE RECOMMENDATIONS WHILE BREASTFEEDING  Eat healthy foods. Alternate between meals and snacks, eating 3 of each per day. Because what you eat affects your breast milk, some of the foods may make your baby more irritable than usual. Avoid eating these foods if you are sure that they are negatively affecting your baby.  Drink milk, fruit juice, and water to satisfy your thirst (about 10 glasses a day).   Rest often, relax, and continue to take your prenatal vitamins to prevent fatigue, stress, and anemia.  Continue breast self-awareness checks.  Avoid chewing and smoking tobacco.  Avoid alcohol and drug use. Some medicines that may be harmful to your baby can pass through breast milk. It is important to ask your health care provider before taking any medicine, including all over-the-counter and prescription medicine as well as vitamin and herbal supplements. It is possible to become pregnant while breastfeeding. If birth control is desired, ask your health care provider about options that will be safe for your baby. SEEK MEDICAL CARE IF:   You feel like you want to stop breastfeeding or have become frustrated with breastfeeding.  You have painful breasts or nipples.  Your nipples are cracked or bleeding.  Your breasts are red, tender, or warm.  You have a swollen area on either breast.  You have a fever or chills.  You have nausea or vomiting.  You have drainage other than breast milk from your nipples.  Your breasts  do not become full before feedings by the 5th day after you give birth.  You feel sad and depressed.  Your baby is too sleepy to eat well.  Your baby is having trouble sleeping.   Your baby is wetting less than 3 diapers in a 24-hour period.  Your baby has less than 3 stools in a 24-hour period.  Your baby's skin or the white part of his or her eyes becomes yellow.   Your baby is not gaining weight by 76 days of age. SEEK IMMEDIATE MEDICAL CARE IF:   Your baby is overly tired (lethargic) and does not want to wake up and feed.  Your baby develops an unexplained fever. Document Released: 04/03/2005 Document Revised: 12/04/2012 Document Reviewed: 09/25/2012 [facility] Patient Information 2014 [place], [place].    Vaginal Delivery During delivery, your health care provider will help you give birth to your baby. During a vaginal delivery, you will work to push the baby out of your vagina. However, before you can push your baby out, a few  things need to happen. The opening of your uterus (cervix) has to soften, thin out, and open up (dilate) all the way to 10 cm. Also, your baby has to move down from the uterus into your vagina.  SIGNS OF LABOR  Your health care provider will first need to make sure you are in labor. Signs of labor include:   Passing what is called the mucous plug before labor begins. This is a small amount of blood-stained mucus.   Having regular, painful uterine contractions.   The time between contractions gets shorter.   The discomfort and pain gradually get more intense.  Contraction pains get worse when walking and do not go away when resting.   Your cervix becomes thinner (effacement) and dilates. BEFORE THE DELIVERY Once you are in labor and admitted into the hospital or care center, your health care provider may do the following:   Perform a complete physical exam.  Review any complications related to pregnancy or labor.  Check your blood  pressure, pulse, temperature, and heart rate (vital signs).   Determine if, and when, the rupture of amniotic membranes occurred.  Do a vaginal exam (using a sterile glove and lubricant) to determine:   The position (presentation) of the baby. Is the baby's head presenting first (vertex) in the birth canal (vagina), or are the feet or buttocks first (breech)?   The level (station) of the baby's head within the birth canal.   The effacement and dilatation of the cervix.   An electronic fetal monitor is usually placed on your abdomen when you first arrive. This is used to monitor your contractions and the baby's heart rate.  When the monitor is on your abdomen (external fetal monitor), it can only pick up the frequency and length of your contractions. It cannot tell the strength of your contractions.  If it becomes necessary for your health care provider to know exactly how strong your contractions are or to see exactly what the baby's heart rate is doing, an internal monitor may be inserted into your vagina and uterus. Your health care provider will discuss the benefits and risks of using an internal monitor and obtain your permission before inserting the device.  Continuous fetal monitoring may be needed if you have an epidural, are receiving certain medicines (such as oxytocin), or have pregnancy or labor complications.  An IV access tube may be placed into a vein in your arm to deliver fluids and medicines if necessary. THREE STAGES OF LABOR AND DELIVERY Normal labor and delivery is divided into three stages. First Stage This stage starts when you begin to contract regularly and your cervix begins to efface and dilate. It ends when your cervix is completely open (fully dilated). The first stage is the longest stage of labor and can last from 3 hours to 15 hours.  Several methods are available to help with labor pain. You and your health care provider will decide which option is best  for you. Options include:   Opioid medicines. These are strong pain medicines that you can get through your IV tube or as a shot into your muscle. These medicines lessen pain but do not make it go away completely.  Epidural. A medicine is given through a thin tube that is inserted in your back. The medicine numbs the lower part of your body and prevents any pain in that area.  Paracervical pain medicine. This is an injection of an anesthetic on each side of your cervix.  You may request natural childbirth, which does not involve the use of pain medicines or an epidural during labor and delivery. Instead, you will use other things, such as breathing exercises, to help cope with the pain. Second Stage The second stage of labor begins when your cervix is fully dilated at 10 cm. It continues until you push your baby down through the birth canal and the baby is born. This stage can take only minutes or several hours.  The location of your baby's head as it moves through the birth canal is reported as a number called a station. If the baby's head has not started its descent, the station is described as being at minus 3 ( 3). When your baby's head is at the zero station, it is at the middle of the birth canal and is engaged in the pelvis. The station of your baby helps indicate the progress of the second stage of labor.  When your baby is born, your health care provider may hold the baby with his or her head lowered to prevent amniotic fluid, mucus, and blood from getting into the baby's lungs. The baby's mouth and nose may be suctioned with a small bulb syringe to remove any additional fluid.  Your health care provider may then place the baby on your stomach. It is important to keep the baby from getting cold. To do this, the health care provider will dry the baby off, place the baby directly on your skin (with no blankets between you and the baby), and cover the baby with warm, dry blankets.   The  umbilical cord is cut. Third Stage During the third stage of labor, your health care provider will deliver the placenta (afterbirth) and make sure your bleeding is under control. The delivery of the placenta usually takes about 5 minutes but can take up to 30 minutes. After the placenta is delivered, a medicine may be given either by IV or injection to help contract the uterus and control bleeding. If you are planning to breastfeed, you can try to do so now. After you deliver the placenta, your uterus should contract and get very firm. If your uterus does not remain firm, your health care provider will massage it. This is important because the contraction of the uterus helps cut off bleeding at the site where the placenta was attached to your uterus. If your uterus does not contract properly and stay firm, you may continue to bleed heavily. If there is a lot of bleeding, medicines may be given to contract the uterus and stop the bleeding.  Document Released: 01/11/2008 Document Revised: 12/04/2012 Document Reviewed: 09/22/2012 [facility] Patient Information 2014 [place], [place].

## 2013-02-24 NOTE — Progress Notes (Signed)
P: 88 Has some nausea today. Has some pink tinged discharge.

## 2013-02-24 NOTE — Progress Notes (Signed)
NST reviewed and reactive. U/s shows EFW at 48%, nml fluid, AC at 28% so appropriate growth-doubt we need early delivery

## 2013-02-28 ENCOUNTER — Encounter: Payer: Self-pay | Admitting: *Deleted

## 2013-03-02 ENCOUNTER — Encounter (HOSPITAL_COMMUNITY): Payer: Self-pay | Admitting: *Deleted

## 2013-03-02 ENCOUNTER — Inpatient Hospital Stay (HOSPITAL_COMMUNITY)
Admission: AD | Admit: 2013-03-02 | Discharge: 2013-03-05 | DRG: 775 | Disposition: A | Discharge status: Home / Self Care | Payer: Managed Care, Other (non HMO) | Source: Ambulatory Visit | Attending: Obstetrics & Gynecology | Admitting: Obstetrics & Gynecology

## 2013-03-02 DIAGNOSIS — O26873 Cervical shortening, third trimester: Secondary | ICD-10-CM

## 2013-03-02 DIAGNOSIS — Z22330 Carrier of Group B streptococcus: Secondary | ICD-10-CM

## 2013-03-02 DIAGNOSIS — R7611 Nonspecific reaction to tuberculin skin test without active tuberculosis: Secondary | ICD-10-CM | POA: Diagnosis present

## 2013-03-02 DIAGNOSIS — O99892 Other specified diseases and conditions complicating childbirth: Secondary | ICD-10-CM | POA: Diagnosis present

## 2013-03-02 DIAGNOSIS — O99820 Streptococcus B carrier state complicating pregnancy: Secondary | ICD-10-CM

## 2013-03-02 DIAGNOSIS — N83202 Unspecified ovarian cyst, left side: Secondary | ICD-10-CM

## 2013-03-02 NOTE — MAU Note (Signed)
PT SAYS SHE GETS PNC    AT CLINIC.   SEEN LAST ON Monday  VE 2 WEEKS- 1 CM  .   DENIES HSV AND MRSA.

## 2013-03-02 NOTE — MAU Note (Signed)
cntractions

## 2013-03-02 NOTE — MAU Note (Signed)
PT SAYS SHE GETS PNC IN CLINIC.    VE 2 WEEKS AGO-  1 CM  .  DENIES HSV AND MRSA.

## 2013-03-03 ENCOUNTER — Encounter (HOSPITAL_COMMUNITY): Payer: Self-pay

## 2013-03-03 ENCOUNTER — Encounter (HOSPITAL_COMMUNITY): Payer: Managed Care, Other (non HMO) | Admitting: Anesthesiology

## 2013-03-03 ENCOUNTER — Inpatient Hospital Stay (HOSPITAL_COMMUNITY): Payer: Managed Care, Other (non HMO) | Admitting: Anesthesiology

## 2013-03-03 DIAGNOSIS — O9989 Other specified diseases and conditions complicating pregnancy, childbirth and the puerperium: Secondary | ICD-10-CM

## 2013-03-03 DIAGNOSIS — R7611 Nonspecific reaction to tuberculin skin test without active tuberculosis: Secondary | ICD-10-CM

## 2013-03-03 LAB — CBC
HCT: 36.3 % (ref 36.0–46.0)
Hemoglobin: 12 g/dL (ref 12.0–15.0)
MCH: 27.3 pg (ref 26.0–34.0)
MCHC: 33.1 g/dL (ref 30.0–36.0)
MCV: 82.5 fL (ref 78.0–100.0)

## 2013-03-03 LAB — DIFFERENTIAL
Basophils Relative: 0 % (ref 0–1)
Eosinophils Absolute: 0 10*3/uL (ref 0.0–0.7)
Eosinophils Relative: 1 % (ref 0–5)
Lymphs Abs: 1.7 10*3/uL (ref 0.7–4.0)
Monocytes Absolute: 0.4 10*3/uL (ref 0.1–1.0)
Monocytes Relative: 6 % (ref 3–12)

## 2013-03-03 LAB — OB RESULTS CONSOLE HIV ANTIBODY (ROUTINE TESTING): HIV: NONREACTIVE

## 2013-03-03 LAB — TYPE AND SCREEN: ABO/RH(D): O POS

## 2013-03-03 LAB — ABO/RH: ABO/RH(D): O POS

## 2013-03-03 MED ORDER — SIMETHICONE 80 MG PO CHEW: 80.0000 mg | CHEWABLE_TABLET | ORAL | Status: DC | PRN

## 2013-03-03 MED ORDER — SENNOSIDES-DOCUSATE SODIUM 8.6-50 MG PO TABS
2.0000 | ORAL_TABLET | ORAL | Status: DC
  Administered 2013-03-03 – 2013-03-04 (×2): 2 via ORAL
  Filled 2013-03-03 (×2): qty 2

## 2013-03-03 MED ORDER — PHENYLEPHRINE 40 MCG/ML (10ML) SYRINGE FOR IV PUSH (FOR BLOOD PRESSURE SUPPORT)
80.0000 ug | PREFILLED_SYRINGE | INTRAVENOUS | Status: DC | PRN
  Filled 2013-03-03: qty 2

## 2013-03-03 MED ORDER — OXYTOCIN 40 UNITS IN LACTATED RINGERS INFUSION - SIMPLE MED
62.5000 mL/h | INTRAVENOUS | Status: DC
  Administered 2013-03-03: 62.5 mL/h via INTRAVENOUS
  Filled 2013-03-03: qty 1000

## 2013-03-03 MED ORDER — FENTANYL 2.5 MCG/ML BUPIVACAINE 1/10 % EPIDURAL INFUSION (WH - ANES)
INTRAMUSCULAR | Status: DC | PRN
  Administered 2013-03-03: 14 mL/h via EPIDURAL

## 2013-03-03 MED ORDER — PENICILLIN G POTASSIUM 5000000 UNITS IJ SOLR
2.5000 10*6.[IU] | INTRAVENOUS | Status: DC
  Administered 2013-03-03 (×2): 2.5 10*6.[IU] via INTRAVENOUS
  Filled 2013-03-03 (×7): qty 2.5

## 2013-03-03 MED ORDER — ZOLPIDEM TARTRATE 5 MG PO TABS: 5.0000 mg | ORAL_TABLET | Freq: Every evening | ORAL | Status: DC | PRN

## 2013-03-03 MED ORDER — IBUPROFEN 600 MG PO TABS
600.0000 mg | ORAL_TABLET | Freq: Four times a day (QID) | ORAL | Status: DC
  Administered 2013-03-03 – 2013-03-05 (×6): 600 mg via ORAL
  Filled 2013-03-03 (×7): qty 1

## 2013-03-03 MED ORDER — LACTATED RINGERS IV SOLN
500.0000 mL | Freq: Once | INTRAVENOUS | Status: AC
  Administered 2013-03-03: 500 mL via INTRAVENOUS

## 2013-03-03 MED ORDER — EPHEDRINE 5 MG/ML INJ
10.0000 mg | INTRAVENOUS | Status: DC | PRN
  Administered 2013-03-03: 10 mg via INTRAVENOUS
  Filled 2013-03-03: qty 4
  Filled 2013-03-03: qty 2

## 2013-03-03 MED ORDER — TETANUS-DIPHTH-ACELL PERTUSSIS 5-2.5-18.5 LF-MCG/0.5 IM SUSP: 0.5000 mL | Freq: Once | INTRAMUSCULAR | Status: DC

## 2013-03-03 MED ORDER — LANOLIN HYDROUS EX OINT: TOPICAL_OINTMENT | CUTANEOUS | Status: DC | PRN

## 2013-03-03 MED ORDER — PRENATAL MULTIVITAMIN CH
1.0000 | ORAL_TABLET | Freq: Every day | ORAL | Status: DC
  Administered 2013-03-04: 1 via ORAL
  Filled 2013-03-03: qty 1

## 2013-03-03 MED ORDER — PENICILLIN G POTASSIUM 5000000 UNITS IJ SOLR
5.0000 10*6.[IU] | Freq: Once | INTRAMUSCULAR | Status: AC
  Administered 2013-03-03: 5 10*6.[IU] via INTRAVENOUS
  Filled 2013-03-03: qty 5

## 2013-03-03 MED ORDER — IBUPROFEN 600 MG PO TABS: 600.0000 mg | ORAL_TABLET | Freq: Four times a day (QID) | ORAL | Status: DC | PRN

## 2013-03-03 MED ORDER — ACETAMINOPHEN 325 MG PO TABS: 650.0000 mg | ORAL_TABLET | ORAL | Status: DC | PRN

## 2013-03-03 MED ORDER — LIDOCAINE HCL (PF) 1 % IJ SOLN
INTRAMUSCULAR | Status: DC | PRN
  Administered 2013-03-03: 3 mL
  Administered 2013-03-03 (×2): 5 mL

## 2013-03-03 MED ORDER — LIDOCAINE HCL (PF) 1 % IJ SOLN
30.0000 mL | INTRAMUSCULAR | Status: DC | PRN
  Filled 2013-03-03 (×2): qty 30

## 2013-03-03 MED ORDER — DIPHENHYDRAMINE HCL 50 MG/ML IJ SOLN: 12.5000 mg | INTRAMUSCULAR | Status: DC | PRN

## 2013-03-03 MED ORDER — LACTATED RINGERS IV SOLN
500.0000 mL | INTRAVENOUS | Status: DC | PRN
  Administered 2013-03-03: 500 mL via INTRAVENOUS

## 2013-03-03 MED ORDER — FENTANYL 2.5 MCG/ML BUPIVACAINE 1/10 % EPIDURAL INFUSION (WH - ANES)
14.0000 mL/h | INTRAMUSCULAR | Status: DC | PRN
  Administered 2013-03-03: 14 mL/h via EPIDURAL
  Filled 2013-03-03 (×2): qty 125

## 2013-03-03 MED ORDER — DIPHENHYDRAMINE HCL 25 MG PO CAPS: 25.0000 mg | ORAL_CAPSULE | Freq: Four times a day (QID) | ORAL | Status: DC | PRN

## 2013-03-03 MED ORDER — DIBUCAINE 1 % RE OINT: 1.0000 "application " | TOPICAL_OINTMENT | RECTAL | Status: DC | PRN

## 2013-03-03 MED ORDER — WITCH HAZEL-GLYCERIN EX PADS: 1.0000 "application " | MEDICATED_PAD | CUTANEOUS | Status: DC | PRN

## 2013-03-03 MED ORDER — ONDANSETRON HCL 4 MG PO TABS: 4.0000 mg | ORAL_TABLET | ORAL | Status: DC | PRN

## 2013-03-03 MED ORDER — PHENYLEPHRINE 40 MCG/ML (10ML) SYRINGE FOR IV PUSH (FOR BLOOD PRESSURE SUPPORT)
80.0000 ug | PREFILLED_SYRINGE | INTRAVENOUS | Status: DC | PRN
  Filled 2013-03-03: qty 10
  Filled 2013-03-03: qty 2

## 2013-03-03 MED ORDER — EPHEDRINE 5 MG/ML INJ
10.0000 mg | INTRAVENOUS | Status: DC | PRN
  Filled 2013-03-03: qty 2

## 2013-03-03 MED ORDER — CITRIC ACID-SODIUM CITRATE 334-500 MG/5ML PO SOLN: 30.0000 mL | ORAL | Status: DC | PRN

## 2013-03-03 MED ORDER — ONDANSETRON HCL 4 MG/2ML IJ SOLN: 4.0000 mg | INTRAMUSCULAR | Status: DC | PRN

## 2013-03-03 MED ORDER — ONDANSETRON HCL 4 MG/2ML IJ SOLN
4.0000 mg | Freq: Four times a day (QID) | INTRAMUSCULAR | Status: DC | PRN
  Administered 2013-03-03: 4 mg via INTRAVENOUS
  Filled 2013-03-03 (×2): qty 2

## 2013-03-03 MED ORDER — OXYCODONE-ACETAMINOPHEN 5-325 MG PO TABS: 1.0000 | ORAL_TABLET | ORAL | Status: DC | PRN

## 2013-03-03 MED ORDER — LACTATED RINGERS IV SOLN
INTRAVENOUS | Status: DC
  Administered 2013-03-03 (×2): via INTRAVENOUS

## 2013-03-03 MED ORDER — MEASLES, MUMPS & RUBELLA VAC ~~LOC~~ INJ
0.5000 mL | INJECTION | Freq: Once | SUBCUTANEOUS | Status: DC
  Filled 2013-03-03: qty 0.5

## 2013-03-03 MED ORDER — BENZOCAINE-MENTHOL 20-0.5 % EX AERO: 1.0000 "application " | INHALATION_SPRAY | CUTANEOUS | Status: DC | PRN

## 2013-03-03 MED ORDER — OXYTOCIN BOLUS FROM INFUSION
500.0000 mL | INTRAVENOUS | Status: DC
  Administered 2013-03-03: 500 mL via INTRAVENOUS

## 2013-03-03 MED ORDER — FENTANYL CITRATE 0.05 MG/ML IJ SOLN: 100.0000 ug | INTRAMUSCULAR | Status: DC | PRN

## 2013-03-03 NOTE — Progress Notes (Signed)
Kelly Krueger is a 32 y.o. G2P0010 at [redacted]w[redacted]d by LMP consistent with 10w [place] admitted for SOL.  Subjective: Comfortable on epidural. Denies pain, change in vision, headache. Brief drop in BP after epidural, baby tolerated well.  Objective: BP 99/62  Pulse 103  Temp(Src) 98 F (36.7 C) (Oral)  Resp 18  Ht 5\' 6"  (1.676 m)  Wt 85.73 kg (189 lb)  BMI 30.52 kg/m2  SpO2 99%  LMP 06/01/2012      FHT:  FHR: 145 bpm, variability: moderate,  accelerations:  Present,  decelerations:  Present variables UC:   Not picking up well on tocometry SVE:   Dilation: 6.5 Effacement (%): 90 Station: -1 Exam by:: Dr Casper Harrison  Labs: Lab Results  Component Value Date   WBC 6.5 03/03/2013   HGB 12.0 03/03/2013   HCT 36.3 03/03/2013   MCV 82.5 03/03/2013   PLT 181 03/03/2013    Assessment / Plan: Spontaneous labor, progressing normally  Labor: Progressing normally, consider AROM / Pit if cervical change stops Preeclampsia:  no signs or symptoms of toxicity Fetal Wellbeing: overall  Category I Pain Control:  Epidural I/D:  Penicillin for GBS POS Anticipated MOD:  NSVD  Clennon Nasca 03/03/2013, 2:54 AM

## 2013-03-03 NOTE — Anesthesia Postprocedure Evaluation (Signed)
  Anesthesia Post-op Note  Anesthesia Post Note  Patient: Kelly Krueger  Procedure(s) Performed: * No procedures listed *  Anesthesia type: Spinal  Patient location: Mother/Baby  Post pain: Pain level controlled  Post assessment: Post-op Vital signs reviewed  Last Vitals:  Filed Vitals:   03/03/13 1415  BP: 112/67  Pulse: 81  Temp: 37.1 C  Resp: 18    Post vital signs: Reviewed  Level of consciousness: awake  Complications: No apparent anesthesia complications

## 2013-03-03 NOTE — H&P (Signed)
Kelly Krueger is a 32 y.o. female presenting for G2P0010 at [redacted]w[redacted]d by LMP consistent with 12w [place] presenting in labor. Pt describes contractions for the last few hours.   Denies gush or leakage of fluids. Endorses fetal movement.   Denies abd pain, vaginal discharge, chest discomfort, SOB, swelling, change/spots on vision.   Pt reports she had most prenatal care in [place] before transferring care to [facility] here, about 1.5 months ago. Unable to find Prenatal labs in transfer package. Denies complication with pregnancy other than some bleeding early in the "20 week range." Per records was admitted at 27w with spotting, received steroids, and had [place] for question of IUGR ([place] normal).  History OB History   Grav Para Term Preterm Abortions TAB SAB Ect Mult Living   2 0 0 0 1 1 0 0 0 0      Past Medical History  Diagnosis Date  . 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 Therapeutic abortion     Family History: family history includes Hypertension in her mother. Social History:  reports that she has never smoked. She has never used smokeless tobacco. She reports that she does not drink alcohol or use illicit drugs.  Cervix was as short as 1.6cm at 26 weeks  Clinic  High Risk   Dating  LMP/Ultrasound:10 weeks Ultrasound consistent with LMP: Yes   Genetic Screen  1 Screen: negative AFP: Normal   Anatomic [place]  normal   GTT  Third trimester: 81   TDaP vaccine  declined   Flu vaccine  declined   GBS  positive   Baby Food  breast   Contraception  undecided   Circumcision  Girl   Pediatrician  undecided    Prenatal Transfer Tool  Maternal Diabetes: No Genetic Screening: Normal in first trimester Maternal Ultrasounds/Referrals: Normal Fetal Ultrasounds or other Referrals:  None Maternal Substance Abuse:  No Significant Maternal Medications:  None Significant Maternal Lab Results:  None Other Comments:  most prenatal care in [place], moved to [place] about  1.5 months ago  ROS: As above.  Dilation: 5.5 Effacement (%): 80 Exam by:: DCALLAWAY, RN Blood pressure 99/69, pulse 93, temperature 98.6 F (37 C), temperature source Oral, resp. rate 20, height 5\' 6"  (1.676 m), weight 85.73 kg (189 lb), last menstrual period 06/01/2012, SpO2 100.00%. Maternal Exam:  Uterine Assessment: Contraction strength is moderate.  Contraction duration is 1 minute. Contraction frequency is regular.   Abdomen: Fetal presentation: vertex  Introitus: Ferning test: not done.  Nitrazine test: not done. Amniotic fluid character: not assessed.  Pelvis: adequate for delivery.   Cervix: Cervix evaluated by digital exam.     Fetal Exam Fetal Monitor Review: Mode: ultrasound.   Baseline rate: 135.  Variability: moderate (6-25 bpm).   Pattern: accelerations present and no decelerations.    Fetal State Assessment: Category I - tracings are normal.     Physical Exam  Constitutional: She is oriented to person, place, and time. She appears well-developed and well-nourished. No distress.  HENT:  Head: Normocephalic and atraumatic.  Eyes: EOM are normal.  Cardiovascular: Normal rate, regular rhythm and normal heart sounds.   No murmur heard. Respiratory: Effort normal and breath sounds normal. No respiratory distress.  GI: Soft. There is no tenderness.  Gravid in contour  Musculoskeletal: She exhibits no edema.  Neurological: She is alert and oriented to person, place, and time.  Skin: Skin is warm and dry. No rash noted. She is  not diaphoretic.  Psychiatric: She has a normal mood and affect. Her behavior is normal.    Prenatal labs: to be drawn ABO, Rh:   Antibody:   Rubella:   RPR:    HBsAg:    HIV:    GBS: Positive (10/27 0000)   Assessment/Plan: Kelly Krueger is a 32 y.o. G2P0010 at [redacted]w[redacted]d by L=10 here for SOOL.  #Labor: in labor, expectant management, reeval 2hrs, if not making change, will consider AROM #Pain: IV pain meds PRN, planning  epidural #FWB: category I, continuous monitoring #ID: penicillin for GBS POS #MOF: breastfeeding #MOC: undecided (?Depo prior to discharge, then final decision postpartum) #PNL: collect prenatal labs now #PPD+/CXR Neg: Will need PP treatment  Street, Cristal Deer 03/03/2013, 12:50 AM

## 2013-03-03 NOTE — Progress Notes (Signed)
Kelly Krueger is a 32 y.o. G2P0010 at [redacted]w[redacted]d by LMP consistent with 10w [place] admitted for SOL.  Subjective: Comfortable on epidural. Tired and feeling pressure  Objective: BP 102/67  Pulse 81  Temp(Src) 98 F (36.7 C) (Oral)  Resp 20  Ht 5\' 6"  (1.676 m)  Wt 85.73 kg (189 lb)  BMI 30.52 kg/m2  SpO2 99%  LMP 06/01/2012      FHT:  FHR: 145 bpm, variability: moderate,  accelerations:  Present,  decelerations:  Present variables UC:   Regular, every 3 min SVE:   Dilation: 8.5 Effacement (%): 100 Station: -1 Exam by:: Dr Casper Harrison   Labs: Lab Results  Component Value Date   WBC 6.5 03/03/2013   HGB 12.0 03/03/2013   HCT 36.3 03/03/2013   MCV 82.5 03/03/2013   PLT 181 03/03/2013    Assessment / Plan: Spontaneous labor, progressing normally. Large forebag.  Labor: Progressing normally, consider AROM if cervical change stops Preeclampsia:  no signs or symptoms of toxicity Fetal Wellbeing: overall  Category I Pain Control:  Epidural I/D:  Penicillin for GBS POS Anticipated MOD:  NSVD  Kelly Morton, MD PGY-2, [facility] 03/03/2013, 5:58 AM

## 2013-03-03 NOTE — Anesthesia Procedure Notes (Signed)
Epidural Patient location during procedure: OB  Staffing Anesthesiologist: Greta Yung Performed by: anesthesiologist   Preanesthetic Checklist Completed: patient identified, site marked, surgical consent, pre-op evaluation, timeout performed, IV checked, risks and benefits discussed and monitors and equipment checked  Epidural Patient position: sitting Prep: ChloraPrep Patient monitoring: heart rate, continuous pulse ox and blood pressure Approach: right paramedian Injection technique: LOR saline  Needle:  Needle type: Tuohy  Needle gauge: 17 G Needle length: 9 cm and 9 Needle insertion depth: 8 cm Catheter type: closed end flexible Catheter size: 20 Guage Catheter at skin depth: 13 cm Test dose: negative  Assessment Events: blood not aspirated, injection not painful, no injection resistance, negative IV test and no paresthesia  Additional Notes   Patient tolerated the insertion well without complications.   

## 2013-03-03 NOTE — Anesthesia Preprocedure Evaluation (Signed)

## 2013-03-03 NOTE — Lactation Note (Signed)
This note was copied from the chart of Kelly Kellene Mccleary. Lactation Consultation Note Initial consultation with first time mom; baby now 2 hours old; mom states she was able to breast feed baby after birth and that it went well. Baby in bassinet showing feeding cues. Discussed feeding cues with mom and dad, enc mom to get baby to breast, offered to assist, mom accepts.  Assisted mom to place baby in football on left, baby has a deep latch with rhythmic sucking, no audible swallowing at this time.  Demonstrated to mom how to hand express, able to get a small glisten colostrum. Mom states she wants to exclusively breast feed. Reviewed br feeding basics from baby and me book. Enc mom to continue frequent STS and cue based feeding. Enc mom to call for assistance if she has any concerns.  Mom made aware of lactation services, community resources, and BFSG. Questions answered.   Patient Name: Kelly Krueger [id number] Date: 03/03/2013 Reason for consult: Initial assessment   Maternal Data Formula Feeding for Exclusion: No Infant to breast within first hour of birth: Yes Has patient been taught Hand Expression?: Yes Does the patient have breastfeeding experience prior to this delivery?: No  Feeding Feeding Type: Breast Fed Length of feed: 10 min  LATCH Score/Interventions Latch: Grasps breast easily, tongue down, lips flanged, rhythmical sucking.  Audible Swallowing: None Intervention(s): Skin to skin;Hand expression  Type of Nipple: Everted at rest and after stimulation  Comfort (Breast/Nipple): Soft / non-tender     Hold (Positioning): Assistance needed to correctly position infant at breast and maintain latch.  LATCH Score: 7  Lactation Tools Discussed/Used     Consult Status Consult Status: Follow-up Follow-up type: In-patient    Octavio Manns [facility] 03/03/2013, 3:25 PM

## 2013-03-04 LAB — CBC
HCT: 29.9 % — ABNORMAL LOW (ref 36.0–46.0)
Hemoglobin: 10.2 g/dL — ABNORMAL LOW (ref 12.0–15.0)
MCH: 28.3 pg (ref 26.0–34.0)
MCV: 83.1 fL (ref 78.0–100.0)
Platelets: 153 10*3/uL (ref 150–400)
RBC: 3.6 MIL/uL — ABNORMAL LOW (ref 3.87–5.11)
RDW: 13.7 % (ref 11.5–15.5)
WBC: 9.4 10*3/uL (ref 4.0–10.5)

## 2013-03-04 LAB — RUBELLA SCREEN: Rubella: <0.1 Index (ref <0.90)

## 2013-03-04 NOTE — Lactation Note (Signed)
This note was copied from the chart of Kelly Krueger. Lactation Consultation Note  Patient Name: Kelly Krueger [id number] Date: 03/04/2013 Reason for consult: Follow-up assessment As a new Mom she is worried if baby is getting anything at the breast. The chart reflects many good feedings with good latch score, adequate voids/stools. Baby asleep at the visit, had recently fed. BF basics reviewed with Mom. Reassured her that all looks well according to baby's chart at this time. Encouraged Mom to call with the next feeding for LC to observe. Left LC phone number for Mom to call.   Maternal Data    Feeding Feeding Type: Breast Fed Length of feed: 15 min  LATCH Score/Interventions                      Lactation Tools Discussed/Used     Consult Status Consult Status: Follow-up Date: 03/04/13 Follow-up type: In-patient    Alfred Levins 03/04/2013, 6:26 PM

## 2013-03-04 NOTE — Progress Notes (Signed)
Post Partum Day 1 Subjective: no complaints.  Kelly Krueger is PPD#1 G2P1011 with NSVD.  Pt states she is doing well, and that her pain is controlled with Motrin.  Pt states she has an appetite and has eaten since delivery. She states she has urinated, however denies BM or flatus.  She denies chest pain, SOB, or pain in her extremities.  Pt states she is breast feeding, and is having some issues with milk production at this point.  She feels that she is not producing milk or having letdown while baby is feeding.  She is currently still deciding on a method of contraception due to the fact that she has never used any in the past.  She also has not been set up with a Theatre manager for f/u, however is open to assistance in setting up GYN, and Pediatrician while she is here.    Objective: Blood pressure 90/56, pulse 80, temperature 98.5 F (36.9 C), temperature source Oral, resp. rate 16, height 5\' 6"  (1.676 m), weight 189 lb (85.73 kg), last menstrual period 06/01/2012, SpO2 99.00%, unknown if currently breastfeeding.  Physical Exam:  Filed Vitals:   03/04/13 0600  BP: 90/56  Pulse: 80  Temp: 98.5 F (36.9 C)  Resp: 16    General: alert, no distress and awake Abdomen: Soft, non-tender, uterine fundus firm.  BS active.   Extremities: Warm, dry, non-erythematous with minor pedal edema bilaterally.   DVT Evaluation: No evidence of DVT seen on physical exam. Negative Homan's sign.   Recent Labs  03/03/13 0120 03/04/13 0540  HGB 12.0 10.2*  HCT 36.3 29.9*    Assessment/Plan: Plan for discharge tomorrow, Breastfeeding, Lactation consult and Contraception to be discussed with pt.  We will talk to her to see if she would like to try Micronor due to the fact that she is breast-feeding.     LOS: 2 days   Loma Newton 03/04/2013, 9:30 AM   I examined pt and agree with documentation above and PA-S plan of care. [facility]

## 2013-03-05 MED ORDER — IBUPROFEN 600 MG PO TABS: 600.0000 mg | ORAL_TABLET | Freq: Four times a day (QID) | ORAL | Status: DC

## 2013-03-05 MED ORDER — NORETHINDRONE 0.35 MG PO TABS: 1.0000 | ORAL_TABLET | Freq: Every day | ORAL | Status: DC

## 2013-03-05 NOTE — Discharge Summary (Signed)
Attestation of Attending Supervision of Advanced Practitioner (CNM/NP): Evaluation and management procedures were performed by the Advanced Practitioner under my supervision and collaboration.  I have reviewed the Advanced Practitioner's note and chart, and I agree with the management and plan.  Neyah Ellerman 03/05/2013 5:00 PM

## 2013-03-05 NOTE — Discharge Summary (Signed)
Obstetric Discharge Summary Reason for Admission: onset of labor Prenatal Procedures: ultrasound Intrapartum Procedures: spontaneous vaginal delivery and GBS prophylaxis Postpartum Procedures: none Complications-Operative and Postpartum: none  Hemoglobin  Date Value Range Status  03/04/2013 10.2* 12.0 - 15.0 g/dL Final     HCT  Date Value Range Status  03/04/2013 29.9* 36.0 - 46.0 % Final   Brief Hospital Course: Kelly Krueger is a 32 y.o. female G2P1011 who presented in labor prior to ROM at [redacted]w[redacted]d on 11/17. She had recently moved from [place] where most prenatal care was given. Prenatal evaluation significant for positive PPD with negative chest x-ray. She requested an epidural, and was given PCN for GBS prophylaxis. Liveborn female with spontaneous cry was delivered over an intact perineum on 11/17 and post-partum course was uneventful. Pt and baby are discharged home after lactation consultation and contraception counseling. They have decided on progestin-only pills.   Physical Exam:  General: alert, cooperative, appears stated age and no distress Lochia: appropriate Uterine Fundus: firm Incision: n/a DVT Evaluation: No evidence of DVT seen on physical exam. Negative Homan's sign. No cords or calf tenderness.  Discharge Diagnoses: Term Pregnancy-delivered; Positive PPD  Discharge Information: Date: 03/05/2013 Activity: unrestricted Diet: routine Medications: Ibuprofen Condition: stable Instructions: refer to practice specific booklet Discharge to: home Follow-up Information   Follow up with [facility].   Contact information:   [address] [place] [address]      Will be called by [facility] to schedule appointment in TB clinic.   Newborn Data: Live born female  Birth Weight: 6 lb 5.8 oz (2886 g) APGAR: 8, 9  Home with mother.  [name] 03/05/2013, 7:42 AM  I have seen and examined this patient and agree with above  documentation in the resident's note.   Rulon Abide, M.D. [facility] Fellow 03/05/2013 12:07 PM

## 2013-04-22 ENCOUNTER — Encounter (HOSPITAL_COMMUNITY): Payer: Self-pay | Admitting: Emergency Medicine

## 2013-04-22 DIAGNOSIS — R7611 Nonspecific reaction to tuberculin skin test without active tuberculosis: Secondary | ICD-10-CM | POA: Insufficient documentation

## 2013-04-22 DIAGNOSIS — K3533 Acute appendicitis with perforation and localized peritonitis, with abscess: Principal | ICD-10-CM | POA: Insufficient documentation

## 2013-04-22 DIAGNOSIS — N83209 Unspecified ovarian cyst, unspecified side: Secondary | ICD-10-CM | POA: Insufficient documentation

## 2013-04-22 NOTE — ED Notes (Signed)
Pt states that she has been having abdominal pain for several hours. Pt states after she ate. Pt cannot pin point an exact location that the pain starts or is more severe just general abdomen. Pt denies problems with bowel movements or urine.

## 2013-04-23 ENCOUNTER — Encounter (HOSPITAL_COMMUNITY)
Admission: EM | Disposition: A | Discharge status: Home / Self Care | Payer: Self-pay | Source: Home / Self Care | Attending: Emergency Medicine

## 2013-04-23 ENCOUNTER — Emergency Department (HOSPITAL_COMMUNITY): Payer: Managed Care, Other (non HMO) | Admitting: Anesthesiology

## 2013-04-23 ENCOUNTER — Emergency Department (HOSPITAL_COMMUNITY): Payer: Managed Care, Other (non HMO)

## 2013-04-23 ENCOUNTER — Encounter (HOSPITAL_COMMUNITY): Payer: Self-pay | Admitting: Radiology

## 2013-04-23 ENCOUNTER — Encounter (HOSPITAL_COMMUNITY): Payer: Managed Care, Other (non HMO) | Admitting: Anesthesiology

## 2013-04-23 ENCOUNTER — Observation Stay (HOSPITAL_COMMUNITY)
Admission: EM | Admit: 2013-04-23 | Discharge: 2013-04-23 | Disposition: A | Discharge status: Home / Self Care | Payer: Managed Care, Other (non HMO) | Attending: General Surgery | Admitting: General Surgery

## 2013-04-23 DIAGNOSIS — K3580 Unspecified acute appendicitis: Secondary | ICD-10-CM | POA: Diagnosis present

## 2013-04-23 DIAGNOSIS — K37 Unspecified appendicitis: Secondary | ICD-10-CM

## 2013-04-23 HISTORY — DX: Gastro-esophageal reflux disease without esophagitis: K21.9

## 2013-04-23 HISTORY — PX: LAPAROSCOPIC APPENDECTOMY: SHX408

## 2013-04-23 HISTORY — PX: APPENDECTOMY: SHX54

## 2013-04-23 LAB — URINALYSIS, ROUTINE W REFLEX MICROSCOPIC
BILIRUBIN URINE: NEGATIVE
GLUCOSE, UA: NEGATIVE mg/dL
HGB URINE DIPSTICK: NEGATIVE
Ketones, ur: NEGATIVE mg/dL
Leukocytes, UA: NEGATIVE
Nitrite: NEGATIVE
Protein, ur: NEGATIVE mg/dL
SPECIFIC GRAVITY, URINE: 1.028 (ref 1.005–1.030)
UROBILINOGEN UA: 0.2 mg/dL (ref 0.0–1.0)
pH: 7.5 (ref 5.0–8.0)

## 2013-04-23 LAB — CBC WITH DIFFERENTIAL/PLATELET
BASOS ABS: 0 10*3/uL (ref 0.0–0.1)
Basophils Relative: 0 % (ref 0–1)
EOS ABS: 0 10*3/uL (ref 0.0–0.7)
Eosinophils Relative: 0 % (ref 0–5)
HCT: 38.4 % (ref 36.0–46.0)
Hemoglobin: 12.8 g/dL (ref 12.0–15.0)
LYMPHS ABS: 1.3 10*3/uL (ref 0.7–4.0)
Lymphocytes Relative: 13 % (ref 12–46)
MCH: 28.1 pg (ref 26.0–34.0)
MCHC: 33.3 g/dL (ref 30.0–36.0)
MCV: 84.2 fL (ref 78.0–100.0)
Monocytes Absolute: 0.5 10*3/uL (ref 0.1–1.0)
Monocytes Relative: 5 % (ref 3–12)
NEUTROS PCT: 82 % — AB (ref 43–77)
Neutro Abs: 8.2 10*3/uL — ABNORMAL HIGH (ref 1.7–7.7)
Platelets: 275 10*3/uL (ref 150–400)
RBC: 4.56 MIL/uL (ref 3.87–5.11)
RDW: 13.4 % (ref 11.5–15.5)
WBC: 10 10*3/uL (ref 4.0–10.5)

## 2013-04-23 LAB — COMPREHENSIVE METABOLIC PANEL
ALK PHOS: 97 U/L (ref 39–117)
ALT: 23 U/L (ref 0–35)
AST: 17 U/L (ref 0–37)
Albumin: 3.9 g/dL (ref 3.5–5.2)
BUN: 10 mg/dL (ref 6–23)
CO2: 26 mEq/L (ref 19–32)
Calcium: 9.4 mg/dL (ref 8.4–10.5)
Chloride: 98 mEq/L (ref 96–112)
Creatinine, Ser: 0.5 mg/dL (ref 0.50–1.10)
GFR calc non Af Amer: >90 mL/min (ref >90)
GLUCOSE: 116 mg/dL — AB (ref 70–99)
POTASSIUM: 3.9 meq/L (ref 3.7–5.3)
SODIUM: 137 meq/L (ref 137–147)
TOTAL PROTEIN: 7.8 g/dL (ref 6.0–8.3)
Total Bilirubin: 0.3 mg/dL (ref 0.3–1.2)

## 2013-04-23 LAB — POCT PREGNANCY, URINE: PREG TEST UR: NEGATIVE

## 2013-04-23 LAB — LIPASE, BLOOD: Lipase: 20 U/L (ref 11–59)

## 2013-04-23 SURGERY — APPENDECTOMY, LAPAROSCOPIC
Anesthesia: General | Site: Abdomen

## 2013-04-23 MED ORDER — FENTANYL CITRATE 0.05 MG/ML IJ SOLN
INTRAMUSCULAR | Status: DC | PRN
  Administered 2013-04-23: 100 ug via INTRAVENOUS
  Administered 2013-04-23 (×3): 50 ug via INTRAVENOUS

## 2013-04-23 MED ORDER — ONDANSETRON HCL 4 MG/2ML IJ SOLN
INTRAMUSCULAR | Status: DC | PRN
  Administered 2013-04-23 (×2): 4 mg via INTRAVENOUS

## 2013-04-23 MED ORDER — IOHEXOL 300 MG/ML  SOLN
25.0000 mL | Freq: Once | INTRAMUSCULAR | Status: AC | PRN
  Administered 2013-04-23: 25 mL via ORAL

## 2013-04-23 MED ORDER — OXYCODONE-ACETAMINOPHEN 5-325 MG PO TABS: 1.0000 | ORAL_TABLET | ORAL | Status: DC | PRN

## 2013-04-23 MED ORDER — LIDOCAINE HCL (CARDIAC) 20 MG/ML IV SOLN
INTRAVENOUS | Status: DC | PRN
  Administered 2013-04-23: 80 mg via INTRAVENOUS

## 2013-04-23 MED ORDER — LACTATED RINGERS IV SOLN
INTRAVENOUS | Status: DC | PRN
  Administered 2013-04-23: 10:00:00 via INTRAVENOUS

## 2013-04-23 MED ORDER — SODIUM CHLORIDE 0.9 % IR SOLN
Status: DC | PRN
  Administered 2013-04-23: 1000 mL

## 2013-04-23 MED ORDER — PIPERACILLIN-TAZOBACTAM 3.375 G IVPB 30 MIN
3.3750 g | Freq: Once | INTRAVENOUS | Status: AC
  Administered 2013-04-23: 3.375 g via INTRAVENOUS
  Filled 2013-04-23: qty 50

## 2013-04-23 MED ORDER — GLYCOPYRROLATE 0.2 MG/ML IJ SOLN
INTRAMUSCULAR | Status: DC | PRN
  Administered 2013-04-23: .6 mg via INTRAVENOUS

## 2013-04-23 MED ORDER — FENTANYL CITRATE 0.05 MG/ML IJ SOLN: 50.0000 ug | Freq: Once | INTRAMUSCULAR | Status: DC

## 2013-04-23 MED ORDER — BUPIVACAINE HCL (PF) 0.25 % IJ SOLN
INTRAMUSCULAR | Status: AC
  Filled 2013-04-23: qty 30

## 2013-04-23 MED ORDER — OXYCODONE HCL 5 MG PO TABS
ORAL_TABLET | ORAL | Status: AC
  Filled 2013-04-23: qty 1

## 2013-04-23 MED ORDER — OXYCODONE HCL 5 MG PO TABS
5.0000 mg | ORAL_TABLET | Freq: Once | ORAL | Status: AC | PRN
Start: 2013-04-23 — End: 2013-04-23
  Administered 2013-04-23: 5 mg via ORAL

## 2013-04-23 MED ORDER — BUPIVACAINE HCL 0.25 % IJ SOLN
INTRAMUSCULAR | Status: DC | PRN
  Administered 2013-04-23: 10 mL

## 2013-04-23 MED ORDER — HYDROMORPHONE HCL PF 1 MG/ML IJ SOLN: 1.0000 mg | INTRAMUSCULAR | Status: DC | PRN

## 2013-04-23 MED ORDER — SODIUM CHLORIDE 0.9 % IV SOLN
10.0000 mg | INTRAVENOUS | Status: DC | PRN
  Administered 2013-04-23: 10 ug/min via INTRAVENOUS

## 2013-04-23 MED ORDER — MIDAZOLAM HCL 2 MG/2ML IJ SOLN: 1.0000 mg | INTRAMUSCULAR | Status: DC | PRN

## 2013-04-23 MED ORDER — OXYCODONE HCL 5 MG/5ML PO SOLN: 5.0000 mg | Freq: Once | ORAL | Status: AC | PRN

## 2013-04-23 MED ORDER — ROCURONIUM BROMIDE 100 MG/10ML IV SOLN
INTRAVENOUS | Status: DC | PRN
  Administered 2013-04-23: 10 mg via INTRAVENOUS

## 2013-04-23 MED ORDER — FENTANYL CITRATE 0.05 MG/ML IJ SOLN
INTRAMUSCULAR | Status: AC
  Filled 2013-04-23: qty 2

## 2013-04-23 MED ORDER — PROPOFOL 10 MG/ML IV BOLUS
INTRAVENOUS | Status: DC | PRN
  Administered 2013-04-23: 180 mg via INTRAVENOUS

## 2013-04-23 MED ORDER — ONDANSETRON HCL 4 MG PO TABS: 4.0000 mg | ORAL_TABLET | Freq: Four times a day (QID) | ORAL | Status: DC | PRN

## 2013-04-23 MED ORDER — CEFAZOLIN SODIUM-DEXTROSE 2-3 GM-% IV SOLR
2.0000 g | INTRAVENOUS | Status: DC
  Filled 2013-04-23: qty 50

## 2013-04-23 MED ORDER — HYDROMORPHONE HCL PF 1 MG/ML IJ SOLN
INTRAMUSCULAR | Status: AC
  Filled 2013-04-23: qty 1

## 2013-04-23 MED ORDER — 0.9 % SODIUM CHLORIDE (POUR BTL) OPTIME
TOPICAL | Status: DC | PRN
  Administered 2013-04-23: 1000 mL

## 2013-04-23 MED ORDER — PROMETHAZINE HCL 25 MG/ML IJ SOLN: 6.2500 mg | INTRAMUSCULAR | Status: DC | PRN

## 2013-04-23 MED ORDER — MIDAZOLAM HCL 5 MG/5ML IJ SOLN
INTRAMUSCULAR | Status: DC | PRN
  Administered 2013-04-23 (×2): 1 mg via INTRAVENOUS

## 2013-04-23 MED ORDER — HYDROMORPHONE HCL PF 1 MG/ML IJ SOLN
0.2500 mg | INTRAMUSCULAR | Status: DC | PRN
  Administered 2013-04-23 (×2): 0.5 mg via INTRAVENOUS

## 2013-04-23 MED ORDER — SODIUM CHLORIDE 0.9 % IV SOLN
Freq: Once | INTRAVENOUS | Status: AC
  Administered 2013-04-23: 50 mL/h via INTRAVENOUS

## 2013-04-23 MED ORDER — NEOSTIGMINE METHYLSULFATE 1 MG/ML IJ SOLN
INTRAMUSCULAR | Status: DC | PRN
  Administered 2013-04-23: 4 mg via INTRAVENOUS

## 2013-04-23 MED ORDER — IOHEXOL 300 MG/ML  SOLN
100.0000 mL | Freq: Once | INTRAMUSCULAR | Status: AC | PRN
  Administered 2013-04-23: 100 mL via INTRAVENOUS

## 2013-04-23 MED ORDER — FENTANYL CITRATE 0.05 MG/ML IJ SOLN
50.0000 ug | Freq: Once | INTRAMUSCULAR | Status: AC
  Administered 2013-04-23: 50 ug via INTRAVENOUS

## 2013-04-23 MED ORDER — SUCCINYLCHOLINE CHLORIDE 20 MG/ML IJ SOLN
INTRAMUSCULAR | Status: DC | PRN
  Administered 2013-04-23: 100 mg via INTRAVENOUS

## 2013-04-23 MED ORDER — SODIUM CHLORIDE 0.9 % IV SOLN
INTRAVENOUS | Status: DC | PRN
  Administered 2013-04-23: 08:00:00 via INTRAVENOUS

## 2013-04-23 MED ORDER — ONDANSETRON HCL 4 MG/2ML IJ SOLN: 4.0000 mg | Freq: Four times a day (QID) | INTRAMUSCULAR | Status: DC | PRN

## 2013-04-23 SURGICAL SUPPLY — 41 items
APPLIER CLIP 5 13 M/L LIGAMAX5 (MISCELLANEOUS)
BENZOIN TINCTURE PRP APPL 2/3 (GAUZE/BANDAGES/DRESSINGS) ×3 IMPLANT
BLADE SURG ROTATE 9660 (MISCELLANEOUS) IMPLANT
CANISTER SUCTION 2500CC (MISCELLANEOUS) ×3 IMPLANT
CHLORAPREP W/TINT 26ML (MISCELLANEOUS) ×3 IMPLANT
CLIP APPLIE 5 13 M/L LIGAMAX5 (MISCELLANEOUS) IMPLANT
COVER SURGICAL LIGHT HANDLE (MISCELLANEOUS) ×3 IMPLANT
COVER TRANSDUCER ULTRASND (DRAPES) IMPLANT
DECANTER SPIKE VIAL GLASS SM (MISCELLANEOUS) IMPLANT
DEVICE TROCAR PUNCTURE CLOSURE (ENDOMECHANICALS) ×3 IMPLANT
DRAPE UTILITY 15X26 W/TAPE STR (DRAPE) ×6 IMPLANT
ELECT REM PT RETURN 9FT ADLT (ELECTROSURGICAL) ×3
ELECTRODE REM PT RTRN 9FT ADLT (ELECTROSURGICAL) ×1 IMPLANT
ENDOLOOP SUT PDS II  0 18 (SUTURE) ×6
ENDOLOOP SUT PDS II 0 18 (SUTURE) ×3 IMPLANT
GLOVE BIO SURGEON STRL SZ7 (GLOVE) ×3 IMPLANT
GLOVE BIO SURGEON STRL SZ7.5 (GLOVE) ×6 IMPLANT
GLOVE BIOGEL PI IND STRL 7.0 (GLOVE) ×1 IMPLANT
GLOVE BIOGEL PI IND STRL 7.5 (GLOVE) ×1 IMPLANT
GLOVE BIOGEL PI INDICATOR 7.0 (GLOVE) ×2
GLOVE BIOGEL PI INDICATOR 7.5 (GLOVE) ×2
GLOVE SURG SS PI 7.0 STRL IVOR (GLOVE) ×3 IMPLANT
GOWN STRL NON-REIN LRG LVL3 (GOWN DISPOSABLE) ×6 IMPLANT
GOWN STRL REIN XL XLG (GOWN DISPOSABLE) ×3 IMPLANT
KIT BASIN OR (CUSTOM PROCEDURE TRAY) ×3 IMPLANT
KIT ROOM TURNOVER OR (KITS) ×3 IMPLANT
NEEDLE INSUFFLATION 14GA 120MM (NEEDLE) ×3 IMPLANT
NS IRRIG 1000ML POUR BTL (IV SOLUTION) ×3 IMPLANT
PAD ARMBOARD 7.5X6 YLW CONV (MISCELLANEOUS) ×6 IMPLANT
SCISSORS LAP 5X35 DISP (ENDOMECHANICALS) ×3 IMPLANT
SET IRRIG TUBING LAPAROSCOPIC (IRRIGATION / IRRIGATOR) ×3 IMPLANT
SLEEVE ENDOPATH XCEL 5M (ENDOMECHANICALS) ×3 IMPLANT
SPECIMEN JAR SMALL (MISCELLANEOUS) ×3 IMPLANT
SUT MNCRL AB 3-0 PS2 18 (SUTURE) ×3 IMPLANT
SUT VIC AB 1 BRD 54 (SUTURE) IMPLANT
TOWEL OR 17X24 6PK STRL BLUE (TOWEL DISPOSABLE) IMPLANT
TOWEL OR 17X26 10 PK STRL BLUE (TOWEL DISPOSABLE) ×3 IMPLANT
TRAY FOLEY CATH 16FR SILVER (SET/KITS/TRAYS/PACK) ×3 IMPLANT
TRAY LAPAROSCOPIC (CUSTOM PROCEDURE TRAY) ×3 IMPLANT
TROCAR XCEL NON-BLD 11X100MML (ENDOMECHANICALS) ×3 IMPLANT
TROCAR XCEL NON-BLD 5MMX100MML (ENDOMECHANICALS) ×3 IMPLANT

## 2013-04-23 NOTE — Anesthesia Preprocedure Evaluation (Addendum)
Anesthesia Evaluation  Patient identified by MRN, date of birth, ID band Patient awake    Reviewed: Allergy & Precautions, H&P , NPO status , Patient's Chart, lab work & pertinent test results  Airway Mallampati: I TM Distance: >3 FB Neck ROM: Full    Dental  (+) Dental Advisory Given Right front tooth with small chip:   Pulmonary  +PPD 4/14 breath sounds clear to auscultation        Cardiovascular Rhythm:Regular Rate:Normal     Neuro/Psych    GI/Hepatic Acute appendicitis   Endo/Other    Renal/GU      Musculoskeletal   Abdominal (+) + obese,   Peds  Hematology   Anesthesia Other Findings   Reproductive/Obstetrics                          Anesthesia Physical Anesthesia Plan  ASA: II and emergent  Anesthesia Plan: General   Post-op Pain Management:    Induction: Intravenous, Rapid sequence and Cricoid pressure planned  Airway Management Planned: Oral ETT  Additional Equipment:   Intra-op Plan:   Post-operative Plan: Extubation in OR  Informed Consent: I have reviewed the patients History and Physical, chart, labs and discussed the procedure including the risks, benefits and alternatives for the proposed anesthesia with the patient or authorized representative who has indicated his/her understanding and acceptance.     Plan Discussed with: CRNA and Surgeon  Anesthesia Plan Comments:         Anesthesia Quick Evaluation

## 2013-04-23 NOTE — Transfer of Care (Signed)
Immediate Anesthesia Transfer of Care Note  Patient: Kelly Krueger  Procedure(s) Performed: Procedure(s): LAPAROSCOPIC APPENDECTOMY (N/A)  Patient Location: PACU  Anesthesia Type:General  Level of Consciousness: sedated  Airway & Oxygen Therapy: Patient Spontanous Breathing and Patient connected to nasal cannula oxygen  Post-op Assessment: Report given to PACU RN and Post -op Vital signs reviewed and stable  Post vital signs: Reviewed and stable  Complications: No apparent anesthesia complications

## 2013-04-23 NOTE — ED Provider Notes (Signed)
CSN: [id number]     Arrival date & time 04/22/13  2333 History   First MD Initiated Contact with Patient 04/23/13 0135     Chief Complaint  Patient presents with  . Abdominal Pain   (Consider location/radiation/quality/duration/timing/severity/associated sxs/prior Treatment) Patient is a 33 y.o. female presenting with abdominal pain. The history is provided by the patient.  Abdominal Pain Pain location:  Generalized Pain quality: aching   Pain radiates to:  Does not radiate Pain severity:  Severe Onset quality:  Sudden Timing:  Constant Progression:  Unchanged Chronicity:  New Context: not sick contacts   Relieved by:  Nothing Worsened by:  Nothing tried Ineffective treatments:  None tried Associated symptoms: no dysuria and no fever   Risk factors: no alcohol abuse     Past Medical History  Diagnosis Date  . 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 Therapeutic abortion     Family History  Problem Relation Age of Onset  . Hypertension Mother    History  Substance Use Topics  . Smoking status: Never Smoker   . Smokeless tobacco: Never Used  . Alcohol Use: No   OB History   Grav Para Term Preterm Abortions TAB SAB Ect Mult Living   2 1 1  0 1 1 0 0 0 1     Review of Systems  Constitutional: Negative for fever.  Gastrointestinal: Positive for abdominal pain.  Genitourinary: Negative for dysuria.  All other systems reviewed and are negative.    Allergies  Review of patient's allergies indicates no known allergies.  Home Medications  No current outpatient prescriptions on file. BP 109/74  Pulse 89  Temp(Src) 98.6 F (37 C) (Oral)  Resp 17  Wt 189 lb 9 oz (85.985 kg)  SpO2 97% Physical Exam  Constitutional: She is oriented to person, place, and time. She appears well-developed and well-nourished. No distress.  HENT:  Head: Normocephalic and atraumatic.  Mouth/Throat: Oropharynx is clear and moist.  Eyes: Conjunctivae are  normal. Pupils are equal, round, and reactive to light.  Neck: Normal range of motion. Neck supple.  Cardiovascular: Normal rate, regular rhythm and intact distal pulses.   Pulmonary/Chest: Effort normal and breath sounds normal. She has no wheezes. She has no rales.  Abdominal: Soft. Bowel sounds are normal. There is tenderness. There is rebound and guarding.  Musculoskeletal: Normal range of motion.  Neurological: She is alert and oriented to person, place, and time.  Skin: Skin is warm and dry.  Psychiatric: She has a normal mood and affect.    ED Course  Procedures (including critical care time) Labs Review Labs Reviewed  CBC WITH DIFFERENTIAL - Abnormal; Notable for the following:    Neutrophils Relative % 82 (*)    Neutro Abs 8.2 (*)    All other components within normal limits  COMPREHENSIVE METABOLIC PANEL - Abnormal; Notable for the following:    Glucose, Bld 116 (*)    All other components within normal limits  LIPASE, BLOOD  URINALYSIS, ROUTINE W REFLEX MICROSCOPIC  POCT PREGNANCY, URINE   Imaging Review No results found.  EKG Interpretation   None       MDM  No diagnosis found. Zosyn keep NPO  Case d/w Dr. Janee Mornhompson to be seen    Hazen Brumett Smitty CordsK Issam Carlyon-Rasch, MD 04/23/13 [phone]

## 2013-04-23 NOTE — Anesthesia Procedure Notes (Signed)
Procedure Name: Intubation Date/Time: 04/23/2013 9:56 AM Performed by: Armandina GemmaMIRARCHI, Karan Ramnauth Pre-anesthesia Checklist: Patient identified, Timeout performed, Emergency Drugs available, Suction available and Patient being monitored Patient Re-evaluated:Patient Re-evaluated prior to inductionOxygen Delivery Method: Circle system utilized Preoxygenation: Pre-oxygenation with 100% oxygen Intubation Type: IV induction Ventilation: Mask ventilation without difficulty Laryngoscope Size: Miller and 2 Grade View: Grade I Tube type: Oral Tube size: 7.0 mm Number of attempts: 1 Airway Equipment and Method: Stylet Placement Confirmation: breath sounds checked- equal and bilateral,  ETT inserted through vocal cords under direct vision and positive ETCO2 Secured at: 22 cm Tube secured with: Tape Dental Injury: Teeth and Oropharynx as per pre-operative assessment  Comments: IV induction Kasik - intubation AM CRNA- chipped tooth present front teeth prior to larygnoscopy- intubation atraumatic teeth and mouth as preop- bilat BS Kasik

## 2013-04-23 NOTE — Anesthesia Postprocedure Evaluation (Signed)
  Anesthesia Post-op Note  Patient: Paticia StackMargaret Longanecker  Procedure(s) Performed: Procedure(s): LAPAROSCOPIC APPENDECTOMY (N/A)  Patient Location: PACU  Anesthesia Type:General  Level of Consciousness: awake and alert   Airway and Oxygen Therapy: Patient Spontanous Breathing  Post-op Pain: mild  Post-op Assessment: Post-op Vital signs reviewed, Patient's Cardiovascular Status Stable, Respiratory Function Stable, Patent Airway, No signs of Nausea or vomiting and Pain level controlled  Post-op Vital Signs: Reviewed and stable  Complications: No apparent anesthesia complications

## 2013-04-23 NOTE — Progress Notes (Signed)
Patient may be discharged today with following requirements:  Tolerating at least liquid diet Tolerating and pain controlled with oral pain medication Voiding Ambulated in hallways  Otherwise stay overnight and discharge in AM. Instructions and follow up appt have been provided.   Vani Gunner, ANP-BC Pager [phone](7A-4:30P)

## 2013-04-23 NOTE — ED Notes (Signed)
Patient updated on wait time 

## 2013-04-23 NOTE — ED Notes (Signed)
Patient is resting comfortably. 

## 2013-04-23 NOTE — Op Note (Signed)
04/23/2013  9:30 AM  PATIENT:  Paticia StackMargaret Hoskinson  33 y.o. female  PRE-OPERATIVE DIAGNOSIS:  ACUTE APPENDICITIS  POST-OPERATIVE DIAGNOSIS:  ACUTE APPENDICITIS, NON PERFORATED  PROCEDURE:  Procedure(s): LAPAROSCOPIC APPENDECTOMY (N/A)  SURGEON:  Surgeon(s) and Role:    * Axel FillerArmando Lynnae Ludemann, MD - Primary  PHYSICIAN ASSISTANT:   ASSISTANTS: none   ANESTHESIA:   general  EBL:   <5CC  BLOOD ADMINISTERED:none  DRAINS: none   LOCAL MEDICATIONS USED:  BUPIVICAINE   SPECIMEN:  Source of Specimen:  APPENDIX  DISPOSITION OF SPECIMEN:  PATHOLOGY  COUNTS:  YES  TOURNIQUET:  * No tourniquets in log *  DICTATION: .Dragon Dictation  Indications for procedure:  The patient is a 33 year old female with a history of periumbilical pain localized in the right lower quadrant patient had a CT scan which revealed signs consistent with acute appendicitis the patient back in for laparoscopic appendectomy.  Details of the procedure:The patient was taken back to the operating room. The patient was placed in supine position with bilateral SCDs in place. After appropriate anitbiotics were confirmed, a time-out was confirmed and all facts were verified.  A pneumoperitoneum of 14 mmHg was obtained via a Veress needle technique in the left lower quadrant quadrant.  A 5 mm trocar and 5 mm camera then placed intra-abdominally there is no injury to any intra-abdominal organs a 10 mm infraumbilical port was placed and direct visualization as was a 5 mm port in the suprapubic area. The appendix was identified  The appendix identified and cleaned down to the appendiceal base. The appendiceal artery was cauterized with Bovie cautery maintaining hemostasis, the mesoappendix was then incised.  The the appendiceal base was clean.  At this time an Endoloop was placed proximallyx2 and one distally and the appendix was transected between these 2. A retreival bag was then placed into the abdomen and the specimen placed into the  bag. The appendiceal stump was cauterized. We evacuate the fluid from the pelvis until the effluent was clear. The omentum was brought over the appendiceal stump. The appendix a latex retrieval  bag was then retrieved via the supraumbilical port. #1 Vicryl was used to reapproximate the fascia at the umbilical port site x1. The skin was reapproximated all port sites 3-0 Monocryl subcuticular fashion. The skin was dressed with Steri-Strips gauze and tape. The patient was awakened from general anesthesia was taken to recovery room in stable condition.       PLAN OF CARE: Admit for overnight observation  PATIENT DISPOSITION:  PACU - hemodynamically stable.   Delay start of Pharmacological VTE agent (>24hrs) due to surgical blood loss or risk of bleeding: not applicable

## 2013-04-23 NOTE — Preoperative (Signed)
Beta Blockers   Reason not to administer Beta Blockers:Not Applicable 

## 2013-04-23 NOTE — Discharge Instructions (Signed)

## 2013-04-23 NOTE — H&P (Signed)
Kelly Krueger is an 33 y.o. female.   Chief Complaint: Right lower quadrant abdominal pain HPI: Patient is 7 weeks postpartum status post normal spontaneous vaginal delivery. She developed generalized central abdominal pain yesterday around 4 PM. The pain persisted and gradually localized to her right lower corner. She was evaluated in the emergency department.Workup included CT scan of the abdomen and pelvis which shows acute appendicitis. Her pain is slightly better after receiving some pain medication. She did not have significant nausea and vomiting.  Past Medical History  Diagnosis Date  . 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 Therapeutic abortion      Family History  Problem Relation Age of Onset  . Hypertension Mother    Social History:  reports that she has never smoked. She has never used smokeless tobacco. She reports that she does not drink alcohol or use illicit drugs.  Allergies: No Known Allergies   (Not in a hospital admission)  Results for orders placed during the hospital encounter of 04/23/13 (from the past 48 hour(s))  CBC WITH DIFFERENTIAL     Status: Abnormal   Collection Time    04/22/13 11:49 PM      Result Value Range   WBC 10.0  4.0 - 10.5 K/uL   RBC 4.56  3.87 - 5.11 MIL/uL   Hemoglobin 12.8  12.0 - 15.0 g/dL   HCT 38.4  36.0 - 46.0 %   MCV 84.2  78.0 - 100.0 fL   MCH 28.1  26.0 - 34.0 pg   MCHC 33.3  30.0 - 36.0 g/dL   RDW 13.4  11.5 - 15.5 %   Platelets 275  150 - 400 K/uL   Neutrophils Relative % 82 (*) 43 - 77 %   Neutro Abs 8.2 (*) 1.7 - 7.7 K/uL   Lymphocytes Relative 13  12 - 46 %   Lymphs Abs 1.3  0.7 - 4.0 K/uL   Monocytes Relative 5  3 - 12 %   Monocytes Absolute 0.5  0.1 - 1.0 K/uL   Eosinophils Relative 0  0 - 5 %   Eosinophils Absolute 0.0  0.0 - 0.7 K/uL   Basophils Relative 0  0 - 1 %   Basophils Absolute 0.0  0.0 - 0.1 K/uL  COMPREHENSIVE METABOLIC PANEL     Status: Abnormal   Collection  Time    04/22/13 11:49 PM      Result Value Range   Sodium 137  137 - 147 mEq/L   Potassium 3.9  3.7 - 5.3 mEq/L   Chloride 98  96 - 112 mEq/L   CO2 26  19 - 32 mEq/L   Glucose, Bld 116 (*) 70 - 99 mg/dL   BUN 10  6 - 23 mg/dL   Creatinine, Ser 0.50  0.50 - 1.10 mg/dL   Calcium 9.4  8.4 - 10.5 mg/dL   Total Protein 7.8  6.0 - 8.3 g/dL   Albumin 3.9  3.5 - 5.2 g/dL   AST 17  0 - 37 U/L   ALT 23  0 - 35 U/L   Alkaline Phosphatase 97  39 - 117 U/L   Total Bilirubin 0.3  0.3 - 1.2 mg/dL   GFR calc non Af Amer >90  >90 mL/min   GFR calc Af Amer >90  >90 mL/min   Comment: (NOTE)     The eGFR has been calculated using the CKD EPI equation.  This calculation has not been validated in all clinical situations.     eGFR's persistently <90 mL/min signify possible Chronic Kidney     Disease.  LIPASE, BLOOD     Status: None   Collection Time    04/22/13 11:49 PM      Result Value Range   Lipase 20  11 - 59 U/L  URINALYSIS, ROUTINE W REFLEX MICROSCOPIC     Status: None   Collection Time    04/23/13 12:02 AM      Result Value Range   Color, Urine YELLOW  YELLOW   APPearance CLEAR  CLEAR   Specific Gravity, Urine 1.028  1.005 - 1.030   pH 7.5  5.0 - 8.0   Glucose, UA NEGATIVE  NEGATIVE mg/dL   Hgb urine dipstick NEGATIVE  NEGATIVE   Bilirubin Urine NEGATIVE  NEGATIVE   Ketones, ur NEGATIVE  NEGATIVE mg/dL   Protein, ur NEGATIVE  NEGATIVE mg/dL   Urobilinogen, UA 0.2  0.0 - 1.0 mg/dL   Nitrite NEGATIVE  NEGATIVE   Leukocytes, UA NEGATIVE  NEGATIVE   Comment: MICROSCOPIC NOT DONE ON URINES WITH NEGATIVE PROTEIN, BLOOD, LEUKOCYTES, NITRITE, OR GLUCOSE <1000 mg/dL.  POCT PREGNANCY, URINE     Status: None   Collection Time    04/23/13 12:08 AM      Result Value Range   Preg Test, Ur NEGATIVE  NEGATIVE   Comment:            THE SENSITIVITY OF THIS     METHODOLOGY IS >24 mIU/mL   Ct Abdomen Pelvis W Contrast  04/23/2013   CLINICAL DATA:  Abdominal pain.  EXAM: CT ABDOMEN AND PELVIS  WITH CONTRAST  TECHNIQUE: Multidetector CT imaging of the abdomen and pelvis was performed using the standard protocol following bolus administration of intravenous contrast.  CONTRAST:  164m OMNIPAQUE IOHEXOL 300 MG/ML  SOLN  COMPARISON:  Obstetrical ultrasound February 21, 2013.  FINDINGS: Included view of the lung bases are clear. Visualized heart and pericardium are unremarkable.  The liver, spleen, gallbladder, pancreas and adrenal glands are unremarkable.  The appendix is enlarged, 10 mm in transaxial dimension with periappendiceal inflammatory changes, coronal 49/134. The stomach, small and large bowel are normal in course and caliber without inflammatory changes. No intraperitoneal free fluid nor free air.  Kidneys are orthotopic, demonstrating symmetric enhancement without nephrolithiasis, hydronephrosis or renal masses. The unopacified ureters are normal in course and caliber. Delayed imaging through the kidneys demonstrates symmetric prompt excretion to the proximal urinary collecting system. Urinary bladder is partially distended and unremarkable.  Great vessels are normal in course and caliber. No lymphadenopathy by CT size criteria. Internal reproductive organs are unremarkable. The soft tissues and included osseous structures are nonsuspicious. Small fat containing umbilical hernia.  IMPRESSION: Acute appendicitis without bowel perforation or abscess.   Electronically Signed   By: CElon Alas  On: 04/23/2013 05:36    Review of Systems  Constitutional: Positive for malaise/fatigue.  HENT: Negative.   Eyes: Negative.   Respiratory: Negative.   Cardiovascular: Negative.   Gastrointestinal: Positive for abdominal pain. Negative for nausea, vomiting and diarrhea.       See history of present illness  Genitourinary: Negative.   Musculoskeletal: Negative.   Skin: Negative.   Neurological: Negative.   Endo/Heme/Allergies:       Currently breast-feeding  Psychiatric/Behavioral:  Negative.     Blood pressure 116/74, pulse 92, temperature 98.6 F (37 C), temperature source Oral, resp. rate 18, weight 189  lb 9 oz (85.985 kg), last menstrual period 05/24/2012, SpO2 99.00%. Physical Exam  Constitutional: She is oriented to person, place, and time. She appears well-developed and well-nourished. No distress.  HENT:  Head: Normocephalic and atraumatic.  Mouth/Throat: Oropharynx is clear and moist. No oropharyngeal exudate.  Eyes: EOM are normal. Pupils are equal, round, and reactive to light. No scleral icterus.  Neck: Normal range of motion. No tracheal deviation present.  Cardiovascular: Normal rate, regular rhythm, normal heart sounds and intact distal pulses.   Respiratory: Effort normal and breath sounds normal. No stridor. No respiratory distress. She has no wheezes. She has no rales.  GI: Soft. Bowel sounds are normal. She exhibits no distension and no mass. There is tenderness. There is guarding. There is no rebound.  Active bowel sounds, right lower quadrant tenderness with voluntary guarding, no generalized tenderness, no Rosvig's sign  Musculoskeletal: Normal range of motion. She exhibits no tenderness.  Neurological: She is alert and oriented to person, place, and time. She exhibits normal muscle tone.  Skin: Skin is warm and dry.  Psychiatric: She has a normal mood and affect.     Assessment/Plan Acute appendicitis: Will receive intravenous antibiotics and we'll plan to proceed with laparoscopic appendectomy this morning. Procedure, risks, and benefits were discussed in detail with her. I answered her questions. She will likely need to pump and dump her breast milk for the next 24 hours. I will discuss her case with my partner, Dr. Ralene Ok.  Grisela Mesch E 04/23/2013, 6:40 AM

## 2013-04-24 NOTE — Discharge Summary (Signed)
  Physician Discharge Summary  Patient ID: Kelly Krueger MRN: [id number] DOB/AGE: [birth year] 32 y.o.  Admit date: 04/23/2013 Discharge date: 04/23/2013  Admitting Diagnosis: Acute early appendicitis   Discharge Diagnosis Patient Active Problem List   Diagnosis Date Noted  . Acute appendicitis 04/23/2013  . Left ovarian cyst 02/24/2013  . Positive PPD   . GBS (group B Streptococcus carrier), +RV culture, currently pregnant 02/15/2013  . Vaginal bleeding in pregnancy 02/03/2013  . Poor fetal growth, affecting management of mother, antepartum condition or complication 01/23/2013  . Short cervix in third trimester, antepartum 01/20/2013    Consultants none  Imaging: Ct Abdomen Pelvis W Contrast  04/23/2013   CLINICAL DATA:  Abdominal pain.  EXAM: CT ABDOMEN AND PELVIS WITH CONTRAST  TECHNIQUE: Multidetector CT imaging of the abdomen and pelvis was performed using the standard protocol following bolus administration of intravenous contrast.  CONTRAST:  100mL OMNIPAQUE IOHEXOL 300 MG/ML  SOLN  COMPARISON:  Obstetrical ultrasound February 21, 2013.  FINDINGS: Included view of the lung bases are clear. Visualized heart and pericardium are unremarkable.  The liver, spleen, gallbladder, pancreas and adrenal glands are unremarkable.  The appendix is enlarged, 10 mm in transaxial dimension with periappendiceal inflammatory changes, coronal 49/134. The stomach, small and large bowel are normal in course and caliber without inflammatory changes. No intraperitoneal free fluid nor free air.  Kidneys are orthotopic, demonstrating symmetric enhancement without nephrolithiasis, hydronephrosis or renal masses. The unopacified ureters are normal in course and caliber. Delayed imaging through the kidneys demonstrates symmetric prompt excretion to the proximal urinary collecting system. Urinary bladder is partially distended and unremarkable.  Great vessels are normal in course and caliber. No lymphadenopathy by CT  size criteria. Internal reproductive organs are unremarkable. The soft tissues and included osseous structures are nonsuspicious. Small fat containing umbilical hernia.  IMPRESSION: Acute appendicitis without bowel perforation or abscess.   Electronically Signed   By: Awilda Metroourtnay  Bloomer   On: 04/23/2013 05:36    Procedures Laparoscopic appendectomy Dr. Derrell Lollingamirez 04/23/13  Hospital Course:  Kelly StackMargaret Lampi is a healthy female [facility].  Workup showed acute appendicitis.  Patient was admitted and underwent procedure listed above.  Tolerated procedure well and was transferred to the floor.  Diet was advanced as tolerated.  On POD#0(evening time), the patient was voiding well, tolerating diet, ambulating well, pain well controlled, vital signs stable, incisions c/d/i and felt stable for discharge home.  Patient will follow up in our office in 3 weeks and knows to call with questions or concerns.  Physical Exam: General:  Alert, NAD, pleasant, comfortable Abd:  Soft, ND, mild tenderness, incisions C/D/I    Medication List         oxyCODONE-acetaminophen 5-325 MG per tablet  Commonly known as:  PERCOCET/ROXICET  Take 1 tablet by mouth every 4 (four) hours as needed for moderate pain.             Follow-up Information   Follow up with [facility] On 05/13/2013. (arrive at 2:30PM )    Contact information:   [address]   [place] [address] [phone]       Signed: Ashok Norrismina Kenidee Cregan, [facility] [phone]  04/24/2013, 2:35 PM

## 2013-04-28 ENCOUNTER — Encounter (HOSPITAL_COMMUNITY): Payer: Self-pay | Admitting: General Surgery

## 2013-05-05 ENCOUNTER — Ambulatory Visit (INDEPENDENT_AMBULATORY_CARE_PROVIDER_SITE_OTHER): Payer: Managed Care, Other (non HMO) | Admitting: Family Medicine

## 2013-05-05 VITALS — BP 104/74 | HR 88 | Ht 65.0 in | Wt 185.6 lb

## 2013-05-05 DIAGNOSIS — R7611 Nonspecific reaction to tuberculin skin test without active tuberculosis: Secondary | ICD-10-CM

## 2013-05-05 DIAGNOSIS — Z3009 Encounter for other general counseling and advice on contraception: Secondary | ICD-10-CM

## 2013-05-05 NOTE — Progress Notes (Signed)
Patient ID: Kelly StackMargaret Krueger, female   DOB: 1980-07-25, 33 y.o.   MRN: [id number] Subjective:     Kelly StackMargaret Krueger is a 33 y.o. female who presents for a postpartum visit. She is 8 week postpartum following a spontaneous vaginal delivery. I have fully reviewed the prenatal and intrapartum course. The delivery was at 39 gestational weeks. Outcome: spontaneous vaginal delivery. Anesthesia: epidural. Postpartum course has been uncomplicatd. Pt had +PPD/Neg CXR has not f/u yet.. Baby's course has been uncomplicated. Baby is feeding by breast. Bleeding no bleeding. Bowel function is normal. Bladder function is normal. Patient is not sexually active. Contraception method is abstinence. Postpartum depression screening: negative.  ASCUS/HPVneg - no repeat pap  The following portions of the patient's history were reviewed and updated as appropriate: allergies, current medications, past family history, past medical history, past social history, past surgical history and problem list.  Review of Systems Pertinent items are noted in HPI.   Objective:    BP 104/74  Pulse 88  Ht 5\' 5"  (1.651 m)  Wt 84.188 kg (185 lb 9.6 oz)  BMI 30.89 kg/m2  LMP 05/24/2012  Breastfeeding? Yes  General:  alert, cooperative, appears stated age and no distress   Breasts:  negative and declined by patient  Lungs: clear to auscultation bilaterally and normal percussion bilaterally  Heart:  regular rate and rhythm, S1, S2 normal, no murmur, click, rub or gallop and normal apical impulse  Abdomen: soft, non-tender; bowel sounds normal; no masses,  no organomegaly        Assessment:     8 postpartum exam. Pap smear not done at today's visit.   Plan:    1. Contraception: POP 2. +PPD/neg CXR - referral sent to [facility] and number provided to patient. 3. Follow up in: as needed

## 2013-05-05 NOTE — Progress Notes (Signed)
Patient plans to use birth control pills. She picked them up but hasn't started yet. She has not been sexually active yet.

## 2013-05-05 NOTE — Patient Instructions (Signed)

## 2013-05-07 ENCOUNTER — Encounter: Payer: Self-pay | Admitting: *Deleted

## 2013-05-13 ENCOUNTER — Ambulatory Visit (INDEPENDENT_AMBULATORY_CARE_PROVIDER_SITE_OTHER): Payer: Managed Care, Other (non HMO) | Admitting: General Surgery

## 2013-05-13 ENCOUNTER — Encounter (INDEPENDENT_AMBULATORY_CARE_PROVIDER_SITE_OTHER): Payer: Self-pay

## 2013-05-13 VITALS — BP 118/71 | HR 70 | Temp 98.6°F | Resp 16 | Ht 65.0 in | Wt 186.0 lb

## 2013-05-13 DIAGNOSIS — K3580 Unspecified acute appendicitis: Secondary | ICD-10-CM

## 2013-05-13 NOTE — Patient Instructions (Signed)
Call again if you have any issues.

## 2013-05-13 NOTE — Progress Notes (Signed)
Kelly Krueger 08-11-1980 [id number] 05/13/2013   History of  Present Illness: Kelly Krueger is a  33 y.o. female who presents today status post lap appy by Eliezer Loftsr.Armando Ramirez, MD.  Pathology reveals;  ACUTE FULL THICKNESS APPENDICITIS AND SEROSITIS WITH PERIAPPENDICEAL ABSCESS FORMATION. - NO TUMOR SEEN.  The patient is tolerating a regular diet, having normal bowel movements, has good pain control.  She  is back to most normal activities.   Physical Exam: BP 118/71  Pulse 70  Temp(Src) 98.6 F (37 C) (Temporal)  Resp 16  Ht 5\' 5"  (1.651 m)  Wt 84.369 kg (186 lb)  BMI 30.95 kg/m2  LMP 05/24/2012  Abd: soft, nontender, active bowel sounds, nondistended.  All incisions are well healed.  Impression: 1.  Acute appendicitis, s/p lap appy ACUTE APPENDICITIS, NON PERFORATED  Plan: She  is able to return to normal activities. She May follow up on a prn basis.

## 2014-02-16 ENCOUNTER — Encounter (INDEPENDENT_AMBULATORY_CARE_PROVIDER_SITE_OTHER): Payer: Self-pay

## 2015-06-03 IMAGING — US US FETAL BPP W/O NONSTRESS
1 series · 16 of 28 positions shown · non-contrast
Comparison: none

[Series 1: us fetal bpp w/o nonstress · 0.23mm/px · 16 of 87 slices shown]
[im 1/87]
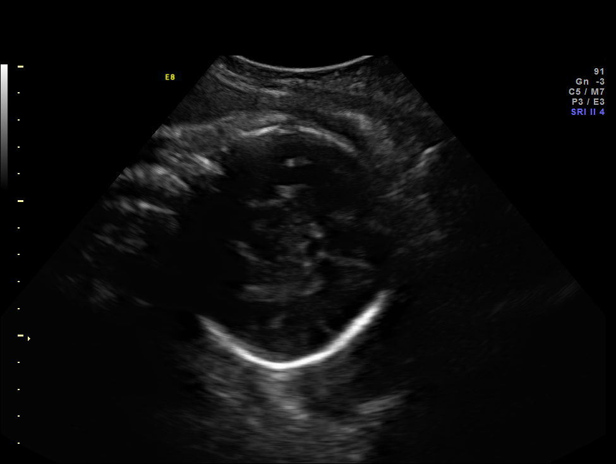
[im 7/87]
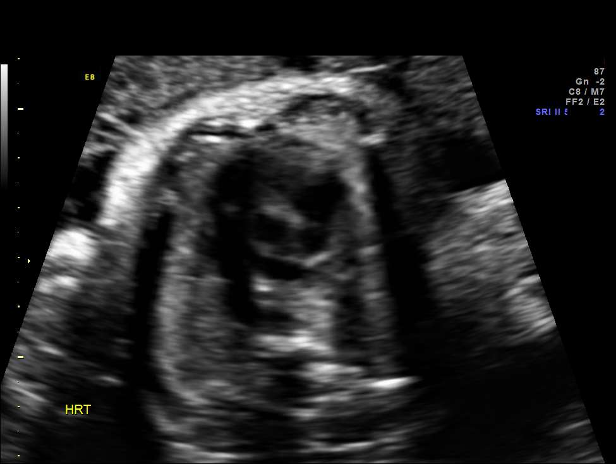
[im 13/87]
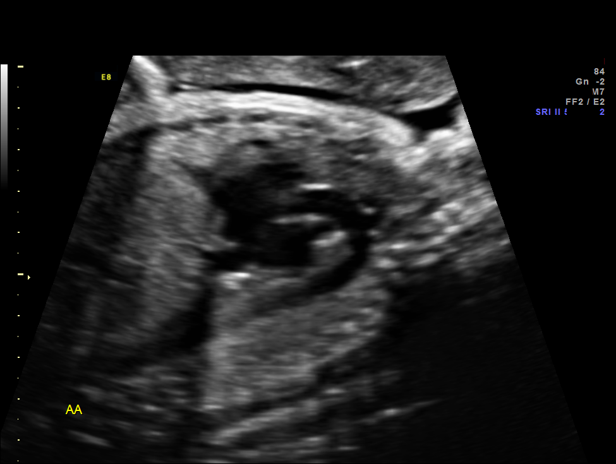
[im 20/87]
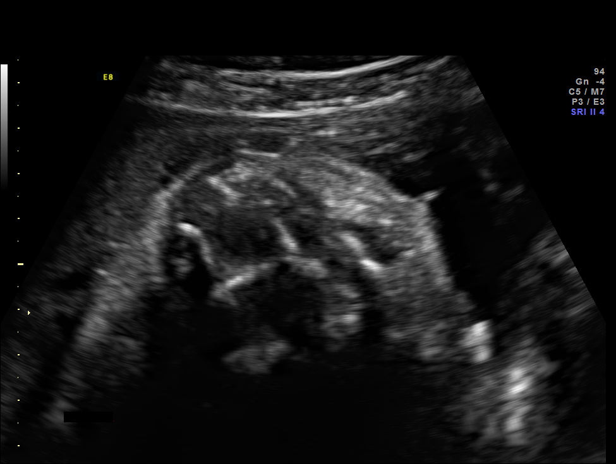
[im 23/87]
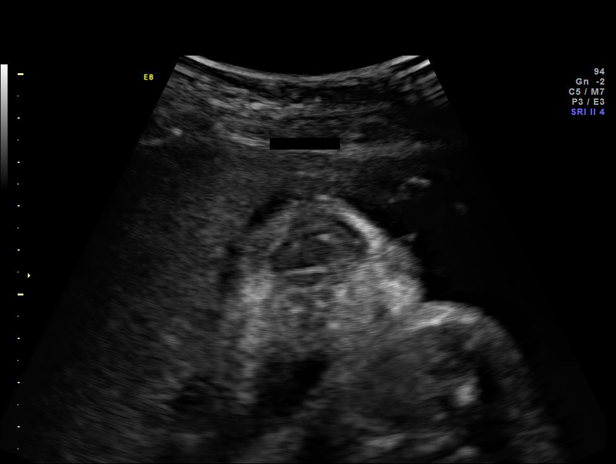
[im 29/87]
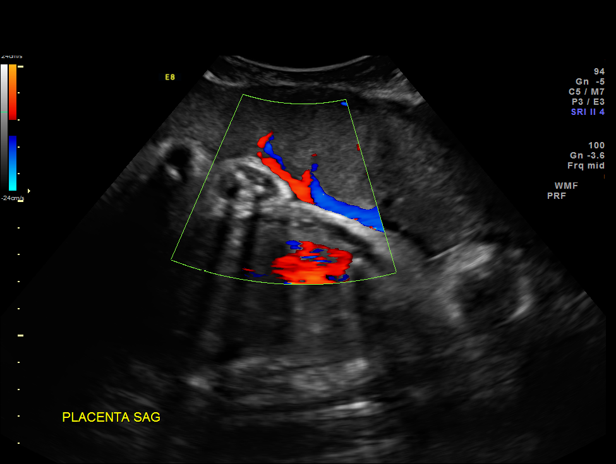
[im 36/87]
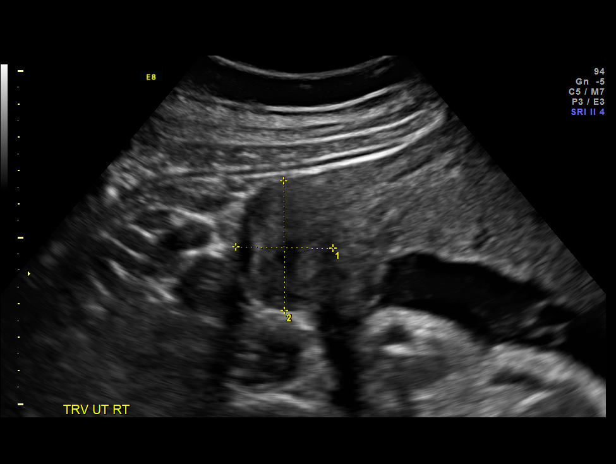
[im 42/87]
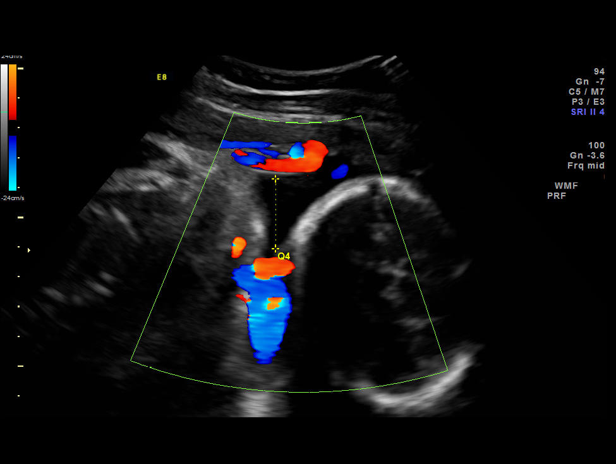
[im 45/87]
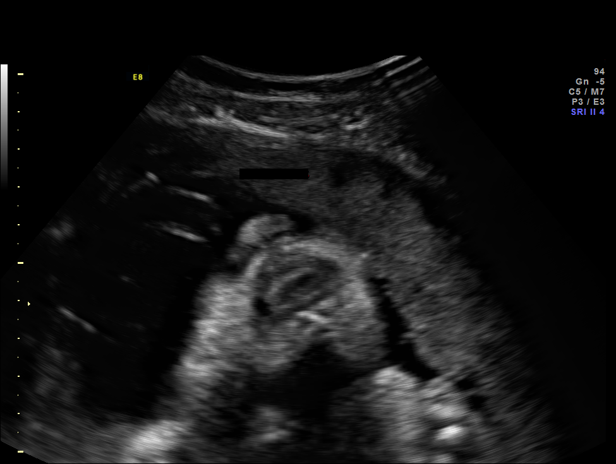
[im 51/87]
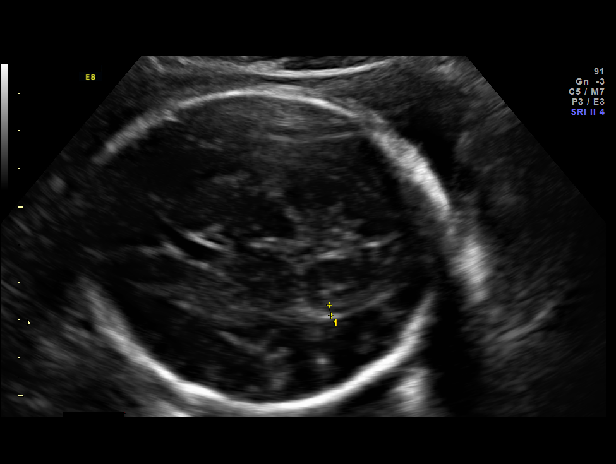
[im 58/87]
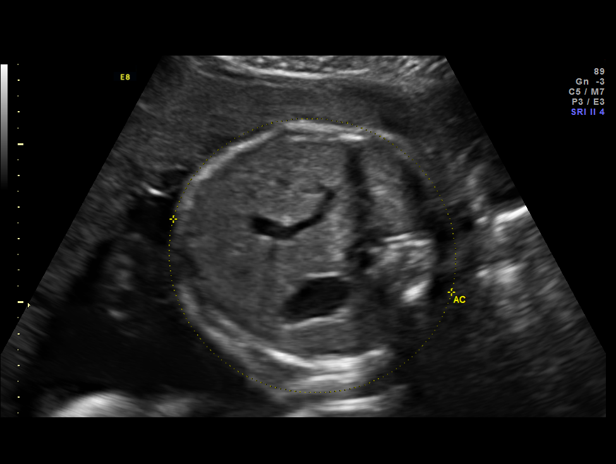
[im 64/87]
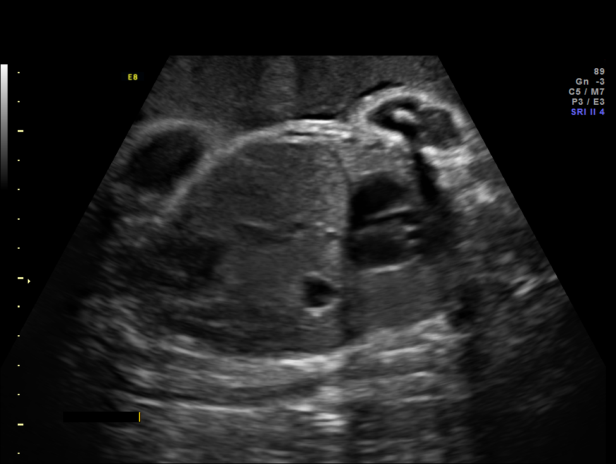
[im 67/87]
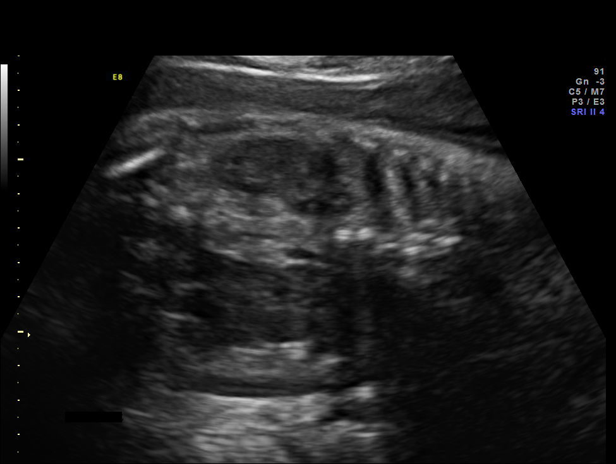
[im 74/87]
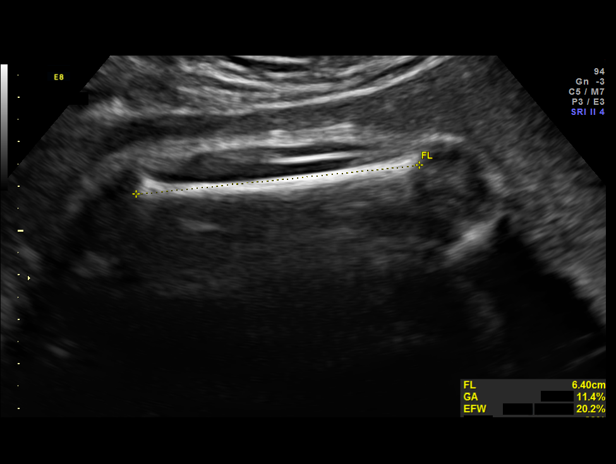
[im 80/87]
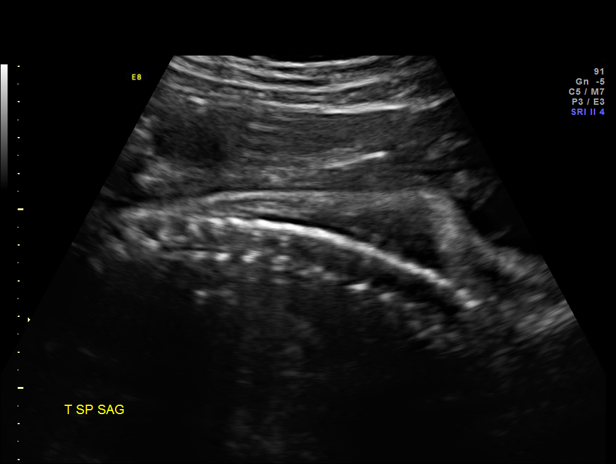
[im 87/87]
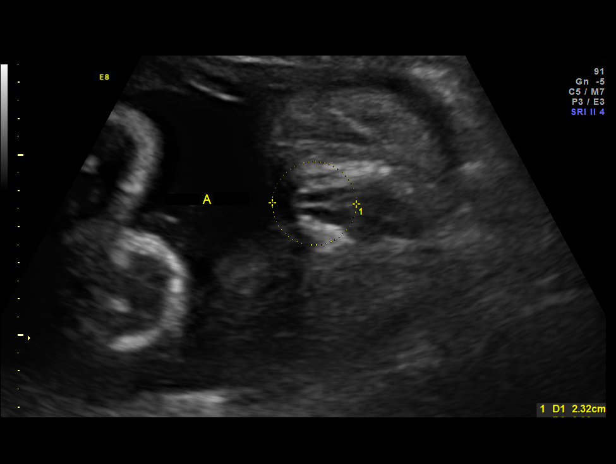

[16 of 28 positions shown; findings below may reference images not displayed]

OBSTETRICS REPORT
                      (Signed Final 01/28/2013 [DATE])

Service(s) Provided

 US OB DETAIL + 14 WK                                  76811.0
 US UA CORD DOPPLER                                    76820.0
Indications

 Intrauterine Growth Restriction
 Cervical shortening
Fetal Evaluation

 Num Of Fetuses:    1
 Fetal Heart Rate:  145                          bpm
 Cardiac Activity:  Observed
 Presentation:      Cephalic
 Placenta:          Anterior, above cervical os
 P. Cord            Visualized, central
 Insertion:

 Amniotic Fluid
 AFI FV:      Subjectively within normal limits
 AFI Sum:     15.15   cm       54  %Tile     Larg Pckt:     5.8  cm
 RUQ:   5.8     cm   RLQ:    2.35   cm    LUQ:   3.56    cm   LLQ:    3.44   cm
Biophysical Evaluation

 Amniotic F.V:   Pocket => 2 cm two         F. Tone:        Observed
                 planes
 F. Movement:    Observed                   Score:          [DATE]
 F. Breathing:   Observed
Biometry

 BPD:     81.6  mm     G. Age:  32w 6d                CI:         84.6   70 - 86
 OFD:     96.4  mm                                    FL/HC:      22.7   19.4 -

 HC:     285.2  mm     G. Age:  31w 2d      < 3  %    HC/AC:      1.02   0.96 -

 AC:     279.7  mm     G. Age:  32w 0d        5  %    FL/BPD:     79.3   71 - 87
 FL:      64.7  mm     G. Age:  33w 3d       18  %    FL/AC:      23.1   20 - 24
 HUM:       58  mm     G. Age:  33w 4d       45  %
 CER:     43.5  mm     G. Age:  37w 4d       81  %

 Est. FW:    3503  gm      4 lb 5 oz     23  %
Gestational Age

 LMP:           34w 3d        Date:  06/01/12                 EDD:   03/08/13
 U/S Today:     32w 3d                                        EDD:   03/22/13
 Best:          34w 3d     Det. By:  LMP  (06/01/12)          EDD:   03/08/13
Anatomy

 Cranium:          Appears normal         Aortic Arch:      Appears normal
 Fetal Cavum:      Appears normal         Ductal Arch:      Appears normal
 Ventricles:       Appears normal         Diaphragm:        Appears normal
 Choroid Plexus:   Appears normal         Stomach:          Appears normal
 Cerebellum:       Appears normal         Abdomen:          Appears normal
 Posterior Fossa:  Appears normal         Abdominal Wall:   Appears nml (cord
                                                            insert, abd wall)
 Nuchal Fold:      Not applicable (>20    Cord Vessels:     Appears normal (3
                   wks GA)                                  vessel cord)
 Face:             Appears normal         Kidneys:          Appear normal
                   (orbits and profile)
 Lips:             Appears normal         Bladder:          Appears normal
 Palate:           Appears normal         Spine:            Not well visualized
 Heart:            Appears normal         Lower             Visualized
                   (4CH, axis, and        Extremities:
                   situs)
 RVOT:             Appears normal         Upper             Visualized
                                          Extremities:
 LVOT:             Appears normal

 Other:  Fetus appears to be a female. Nasal bone visualized. Technically
         difficult due to advanced GA and fetal position.
Targeted Anatomy

 Fetal Central Nervous System
 Cisterna Magna:
Doppler - Fetal Vessels

 Umbilical Artery
 S/D:   2.72           60  %tile       RI:
 PI:    0.94                           PSV:       55.56   cm/s
 Umbilical Artery
 Absent DFV:    No     Reverse DFV:    No

Cervix Uterus Adnexa

 Cervix:       Not visualized (advanced GA >59wks) `

 Left Ovary:    Within normal limits.
 Right Ovary:   Within normal limits.
 Adnexa:     No abnormality visualized.
Myomas

 Site                     L(cm)      W(cm)       D(cm)      Location
 Right
 Blood Flow                  RI       PI       Comments
Impression

 Active SIUP at 34 [DATE] wks for comprehensive fetal survey in
 context of pregnancy complicated by 3 episodes of vaginal
 bleeding/spotting, lagging growth, and a short cervix
 EFW is at the 23rd percentile with AC at the 5th percentile
 and symmetric growth pattern by HC/AC 1.02--not consistent
 with IUGR
 UA Doppler S/D is near the mean for gestational age (normal)
 AFI is gestational age appropriate
 Low risk first trimester screen
 BPP is [DATE]

 Today's findings and implications were discussed by way of
 formal MFM consultation.  She reports no symptoms of
 vaginal bleeding, leakage of fluid, or uterine contractions.
 Cervix was not assessed by TVUS at this gestational age in
 context of no preterm labor symptoms.
Recommendations

 Given the history of vaginal bleeding in this pregnancy, I
 recommend antenatal testing and fetal growth surveillance as
 follows:

 1. twice weekly NST
 2. weekly AFI
 3. follow up growth in 3-4 weeks
 4. fetal Jeanvmichelet Exuis
 5. preterm labor/vaginal bleeding precautions

 Please see MFM consultation regarding recommendations
 regarding timing of delivery in context of technically normal
 growth and resolved vaginal bleeding.

## 2015-06-10 IMAGING — US US FETAL BPP W/O NONSTRESS
1 series · 13 of 15 positions shown · non-contrast
Comparison: none

[Series 1: us fetal bpp w/o nonstress · 0.23mm/px · 13 of 15 slices shown]
[im 1/15]
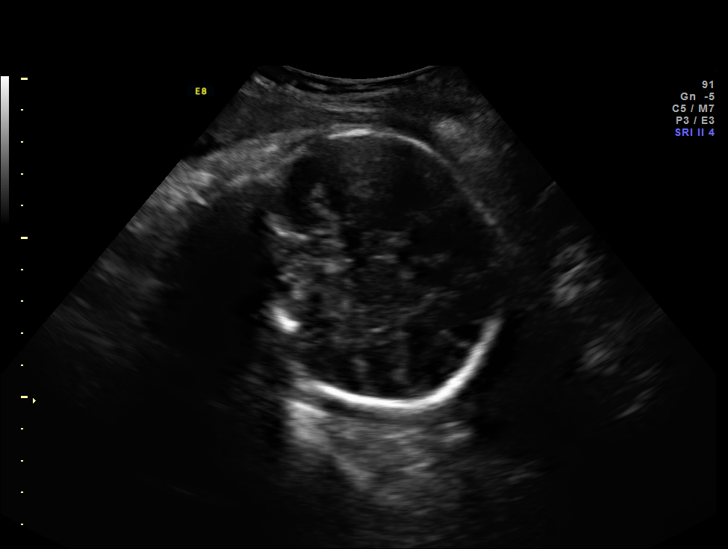
[im 2/15]
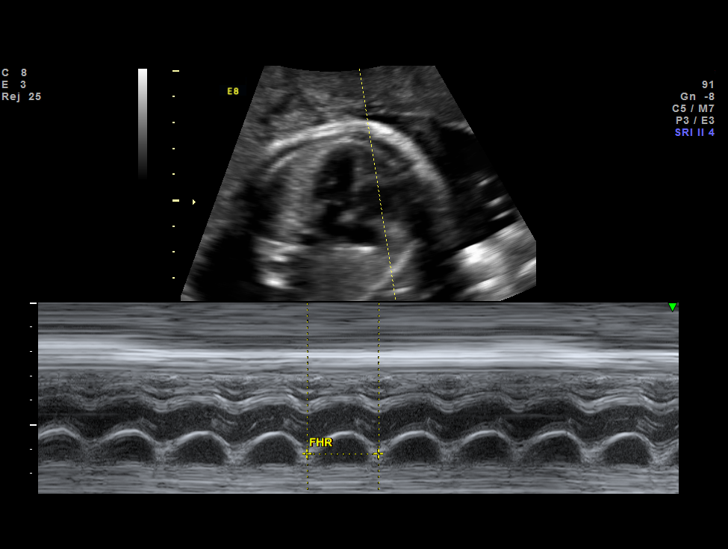
[im 3/15]
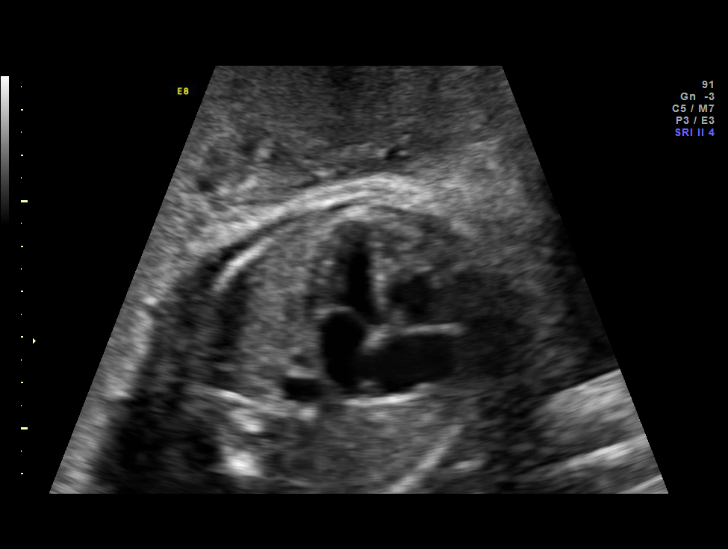
[im 5/15]
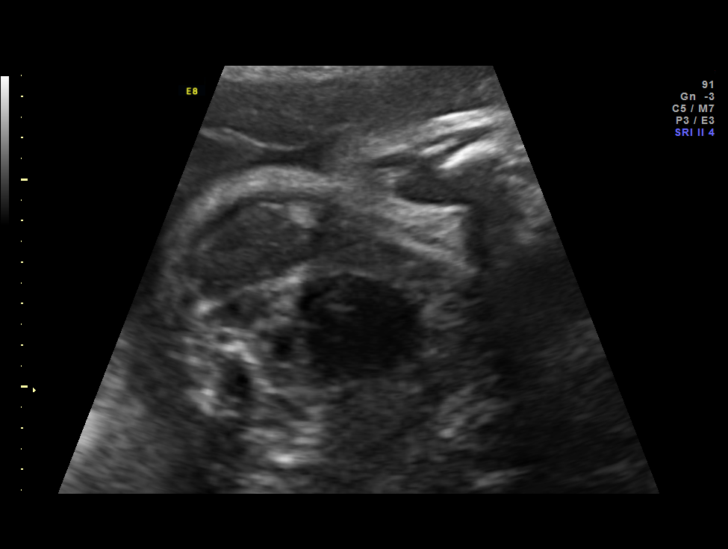
[im 6/15]
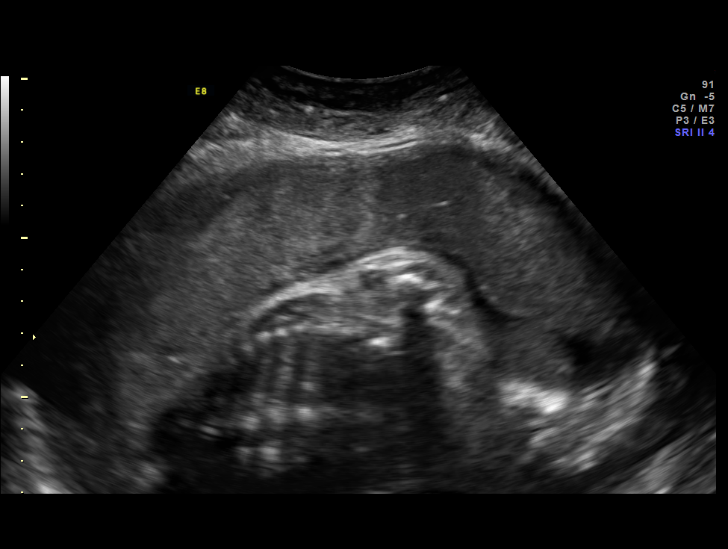
[im 7/15]
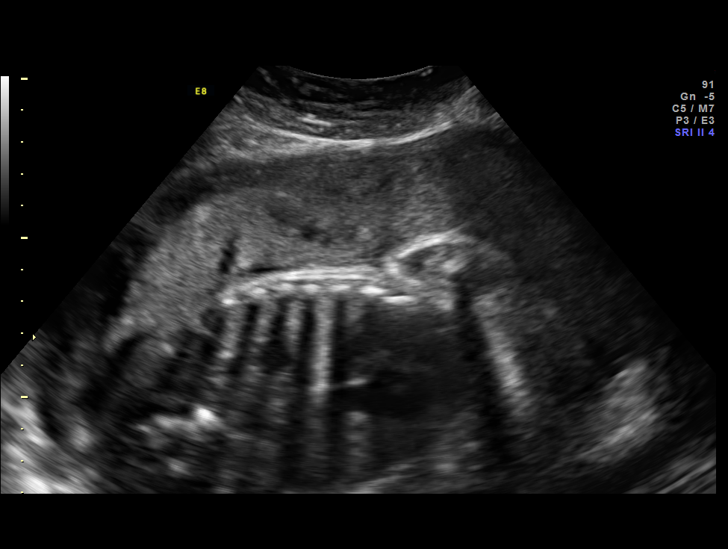
[im 8/15]
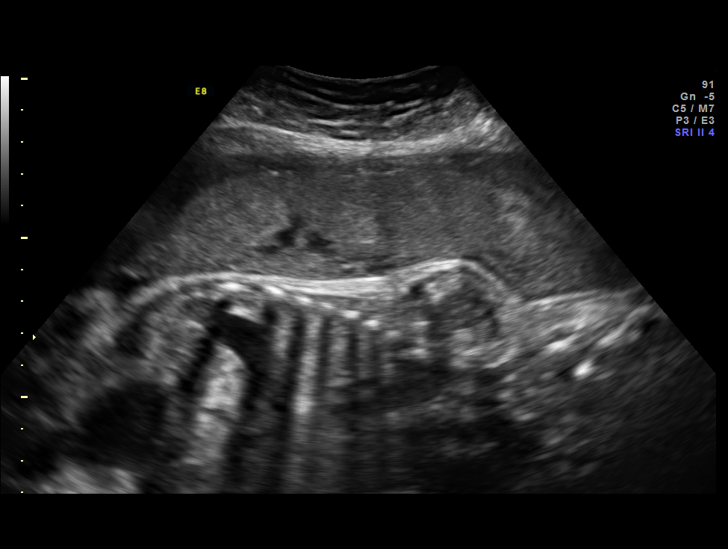
[im 9/15]
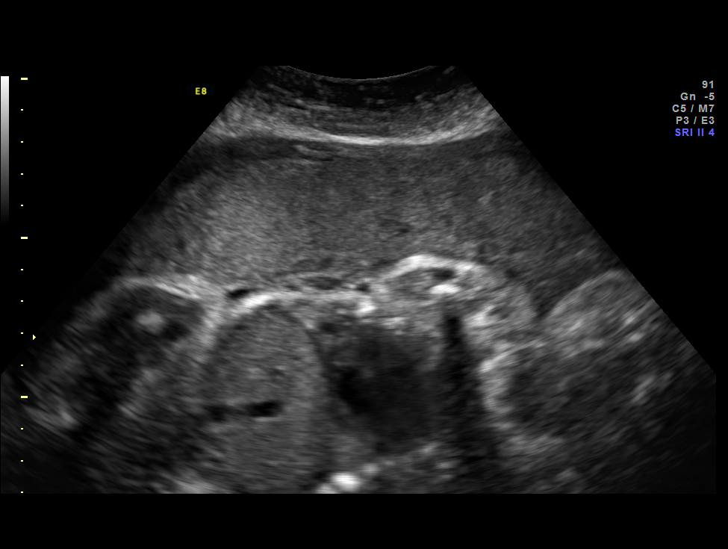
[im 10/15]
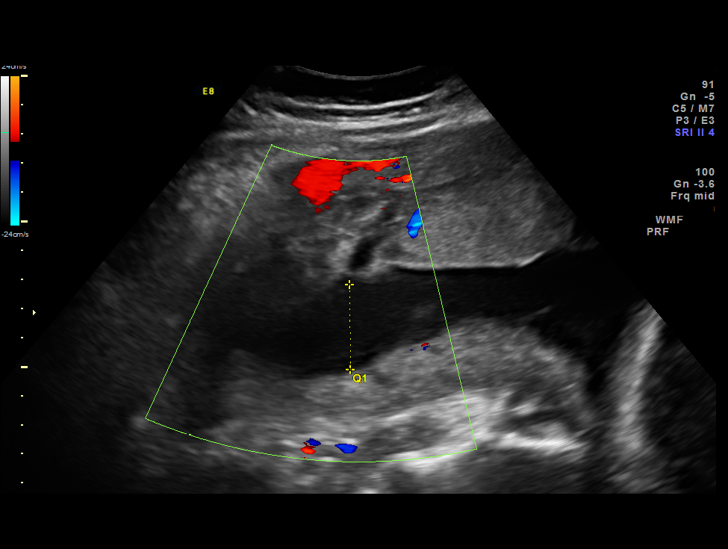
[im 11/15]
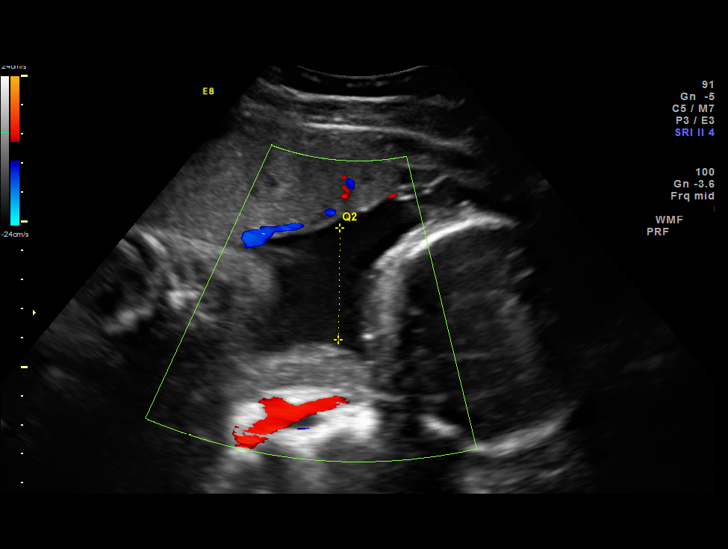
[im 13/15]
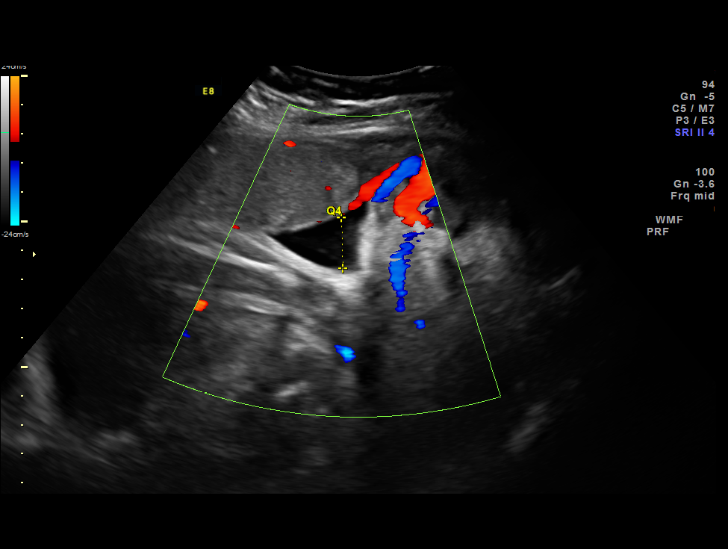
[im 14/15]
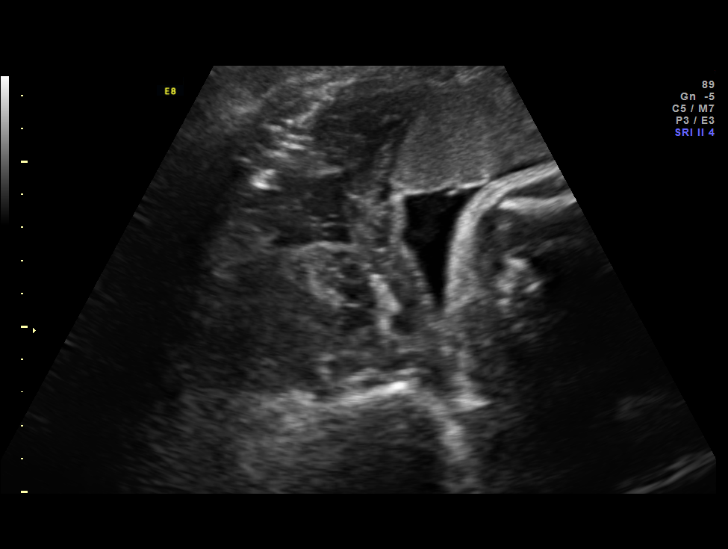
[im 15/15]
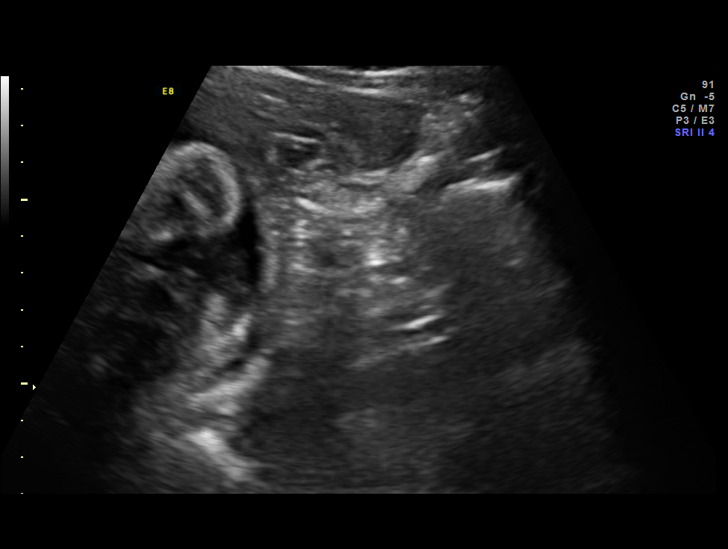

[13 of 15 positions shown; findings below may reference images not displayed]

OBSTETRICS REPORT
                      (Signed Final 02/04/2013 [DATE])

Service(s) Provided

 [HOSPITAL]                                         76815.0
Indications

 Non-reactive NST
 Size less than dates (Small for gestational [AGE]
 FGR) - AC 5%
 Cervical shortening
Fetal Evaluation

 Num Of Fetuses:    1
 Fetal Heart Rate:  141                          bpm
 Cardiac Activity:  Observed
 Presentation:      Cephalic
 Placenta:          Anterior, above cervical os
 P. Cord            Previously Visualized
 Insertion:

 Amniotic Fluid
 AFI FV:      Subjectively within normal limits
 AFI Sum:     12.09   cm       37  %Tile     Larg Pckt:    3.84  cm
 RUQ:   2.92    cm   RLQ:    1.74   cm    LUQ:   3.84    cm   LLQ:    3.59   cm
Biophysical Evaluation

 Amniotic F.V:   Pocket => 2 cm two         F. Tone:        Observed
                 planes
 F. Movement:    Observed                   Score:          [DATE]
 F. Breathing:   Observed
Gestational Age

 LMP:           35w 3d        Date:  06/01/12                 EDD:   03/08/13
 Best:          35w 3d     Det. By:  LMP  (06/01/12)          EDD:   03/08/13
Comments

 Ms. Uche NST was non-reactive here at the [facility]
 earlier today.
Impression

 Single living intrauterine pregnancy at 35 weeks 3 days.
 Normal amniotic fluid volume.
 BPP [DATE] (-2 non-reactive NST).
Recommendations

 Continue twice weekly NST's and weekly AFI's.
 Repeat growth in 2 weeks.

                Sam, Mahyou

## 2015-06-13 IMAGING — US US FETAL BPP W/O NONSTRESS
2 series · 14 of 14 positions shown · non-contrast
Comparison: none

[Series 1: us fetal bpp w/o nonstress · non-contrast · 5 of 5 slices shown (1 of 2)]
[im 1/5]
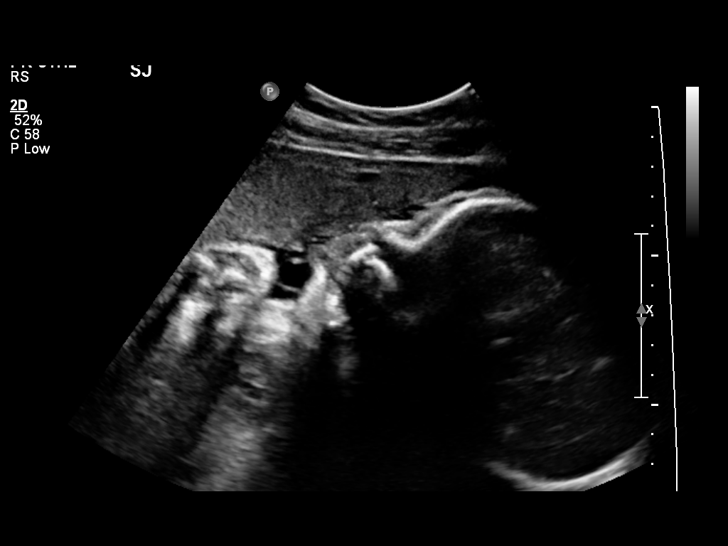
[im 2/5]
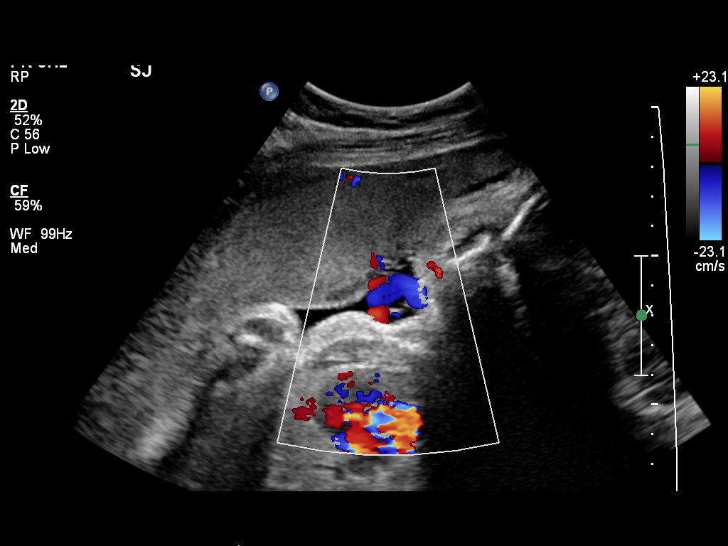
[im 3/5]
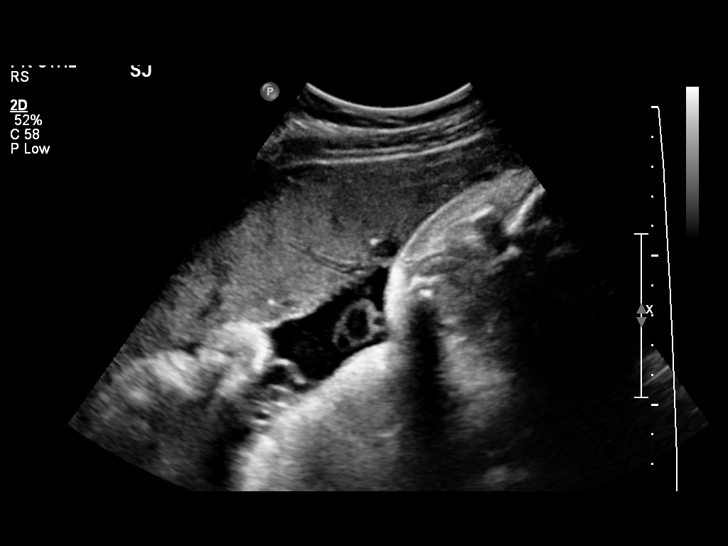
[im 4/5]
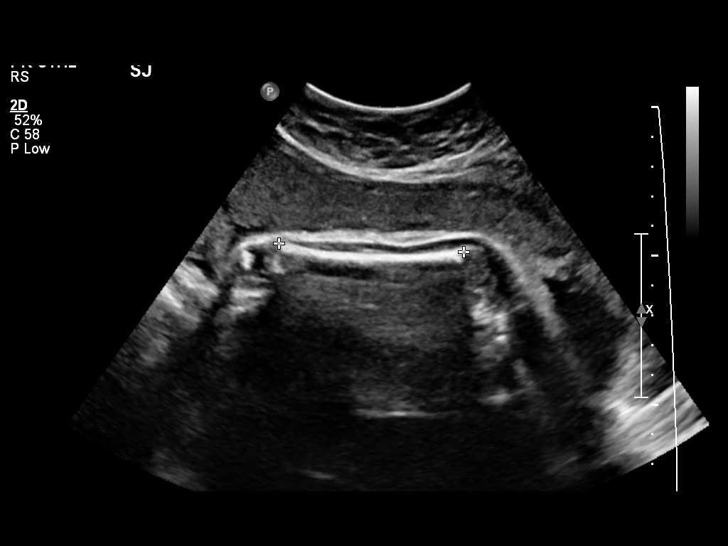
[im 5/5]
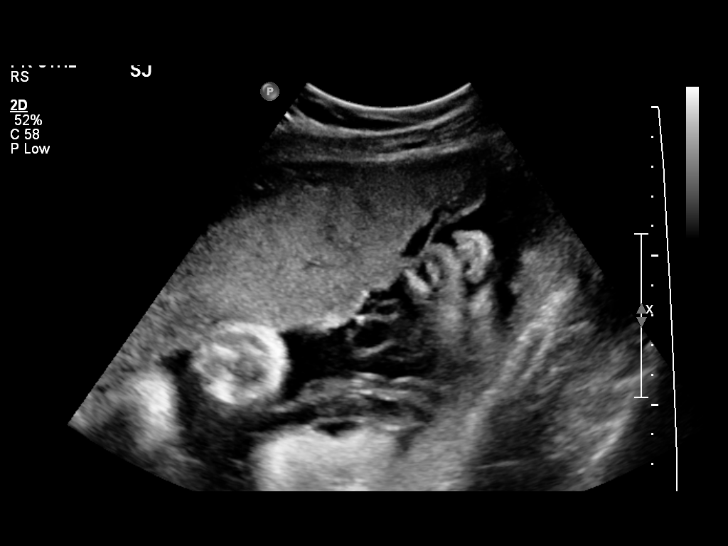

[Series 1: us fetal bpp w/o nonstress · non-contrast · 9 acquisitions, 9 frames shown (2 of 2)]
[im 1/9]
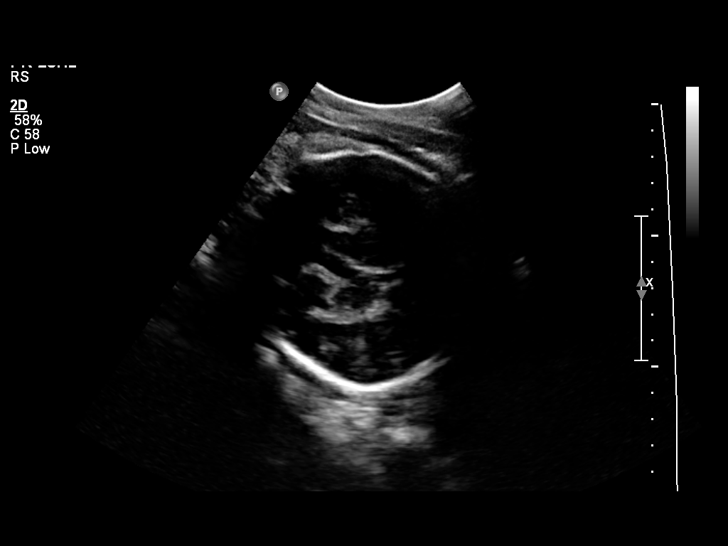
[im 2/9]
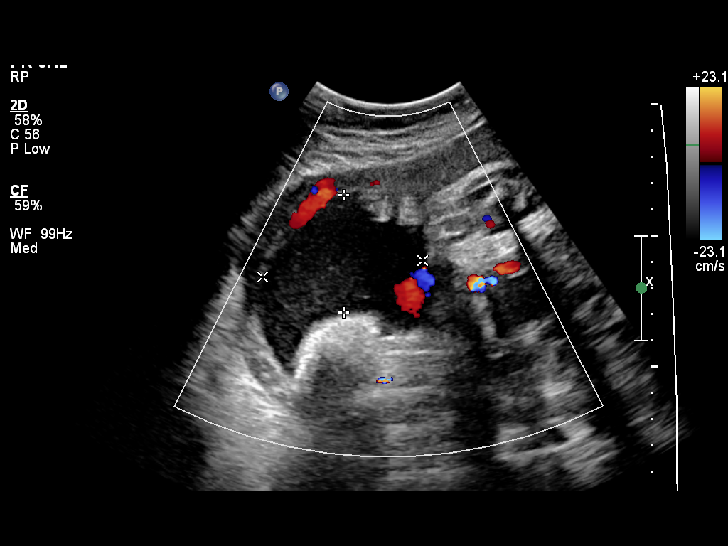
[im 3/9]
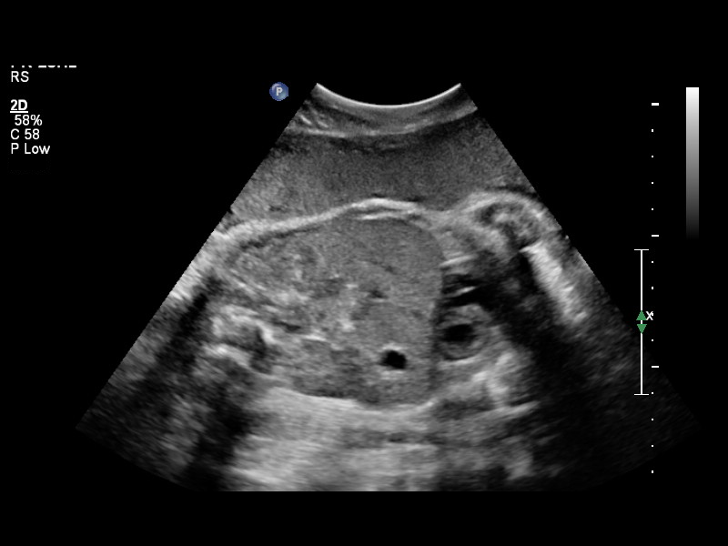
[im 4/9]
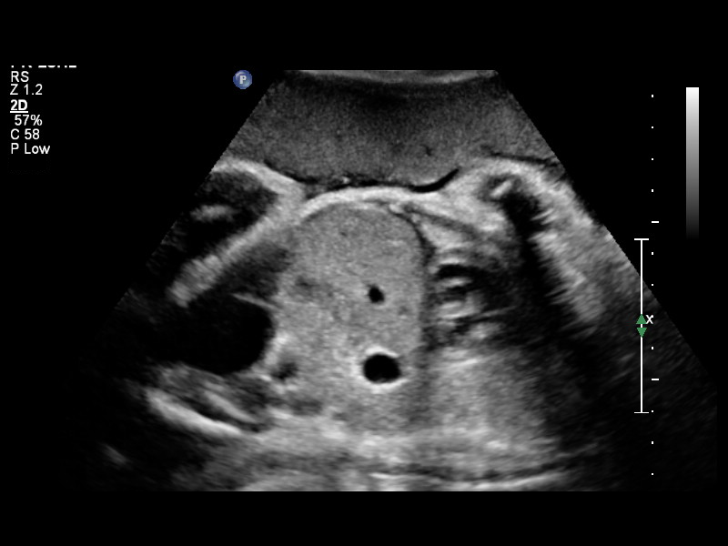
[im 5/9]
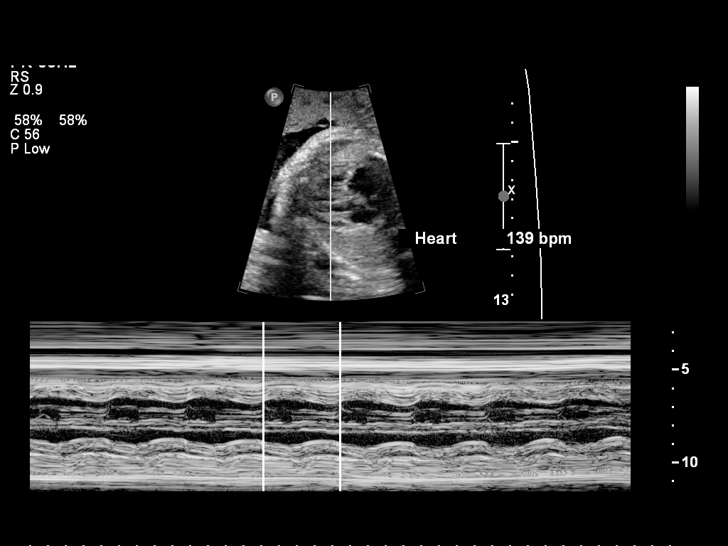
[im 6/9]
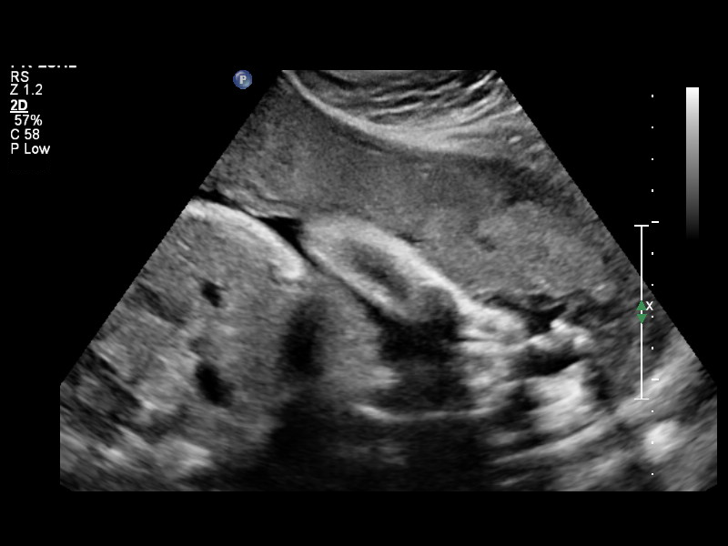
[im 7/9]
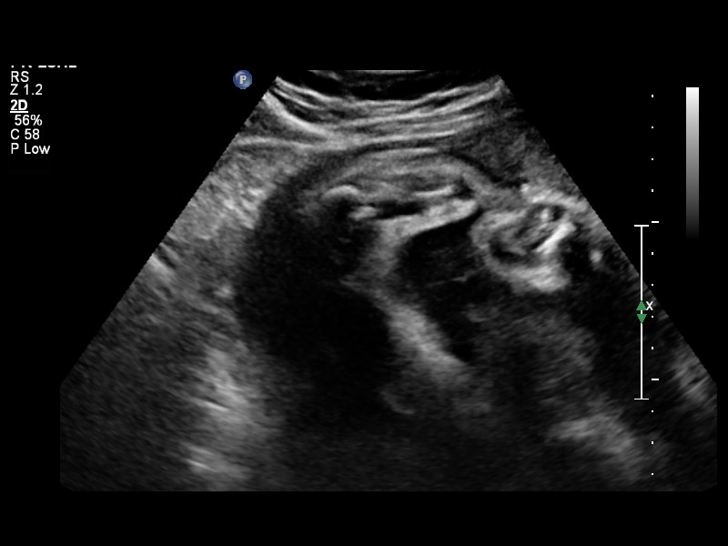
[im 8/9]
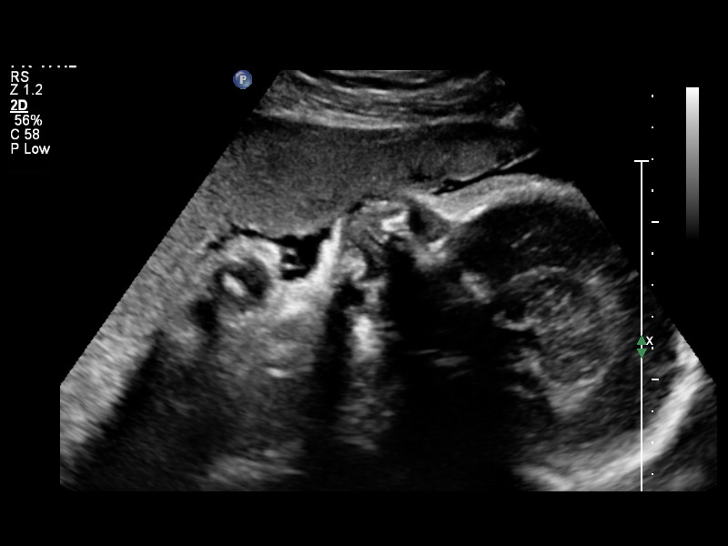
[im 9/9]
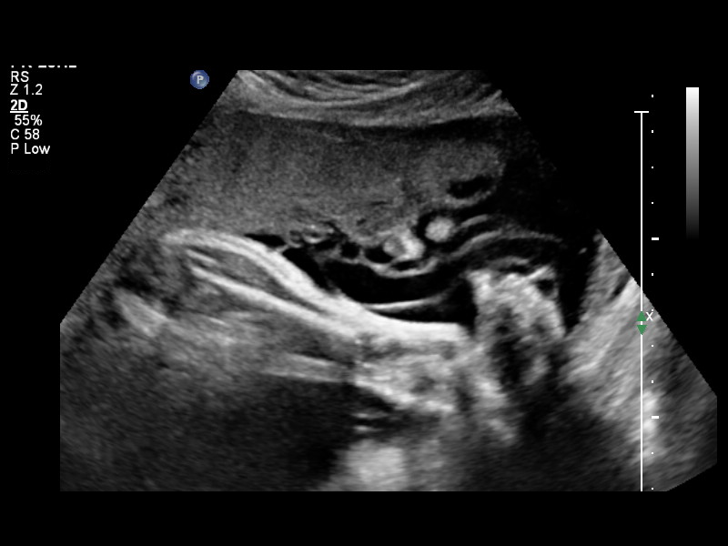

[14 of 14 positions shown; findings below may reference images not displayed]

OBSTETRICS REPORT
                      (Signed Final 02/07/2013 [DATE])

Service(s) Provided

Indications

 Non-reactive NST
Fetal Evaluation

 Num Of Fetuses:    1
 Fetal Heart Rate:  139                          bpm
 Cardiac Activity:  Observed
 Presentation:      Cephalic

 Comment:    BPP [DATE] in 6 minutes.
Biophysical Evaluation

 Amniotic F.V:   Pocket => 2 cm two         F. Tone:        Observed
                 planes
 F. Movement:    Observed                   Score:          [DATE]
 F. Breathing:   Observed
Gestational Age

 LMP:           35w 6d        Date:  06/01/12                 EDD:   03/08/13
 Best:          35w 6d     Det. By:  LMP  (06/01/12)          EDD:   03/08/13
Impression

 Single IUP at 35 [DATE] weeks
 Lagging AC noted on previous ultrasound
 Active fetus with BPP of [DATE] (had non reactive NST in clinic
 earlier today)
 Normal amnioitc fluid volume
Recommendations

 Recommend continued 2x weekly NSTs with weekly AFIs
 Follow up growth scan in 2 weeks.

## 2018-04-22 ENCOUNTER — Encounter (INDEPENDENT_AMBULATORY_CARE_PROVIDER_SITE_OTHER): Payer: Self-pay

## 2018-04-22 ENCOUNTER — Ambulatory Visit (INDEPENDENT_AMBULATORY_CARE_PROVIDER_SITE_OTHER): Payer: Commercial Managed Care - POS | Admitting: Family Medicine

## 2018-04-22 VITALS — BP 105/72 | HR 83 | Temp 98.6°F | Resp 18 | Ht 65.0 in | Wt 164.0 lb

## 2018-04-22 DIAGNOSIS — R6889 Other general symptoms and signs: Secondary | ICD-10-CM

## 2018-04-22 DIAGNOSIS — J111 Influenza due to unidentified influenza virus with other respiratory manifestations: Secondary | ICD-10-CM

## 2018-04-22 LAB — POCT RAPID STREP A: Rapid Strep A Screen POCT: NEGATIVE

## 2018-04-22 LAB — POCT INFLUENZA A/B
POCT Rapid Influenza A AG: NEGATIVE
POCT Rapid Influenza B AG: POSITIVE — AB

## 2018-04-22 MED ORDER — OSELTAMIVIR PHOSPHATE 75 MG PO CAPS
75.00 mg | ORAL_CAPSULE | Freq: Two times a day (BID) | ORAL | 0 refills | Status: AC
Start: 2018-04-22 — End: 2018-04-27

## 2018-04-22 NOTE — Progress Notes (Signed)
[facility]  PROGRESS NOTE     Patient: Caroline Peck   Date: 04/22/2018   MRN: [id number]       Caroline Peck is a 38 y.o. female      HISTORY     Chief Complaint   Patient presents with    Flu like symptoms     for couples of days, bodyache, sore throat, Temp 100.6 today , coughing         Works in [employer] at [employer]       History provided by:  Patient  Language interpreter used: No    Influenza   This is a new problem. Episode onset: 2 days. The problem occurs constantly. The problem has been gradually worsening. Associated symptoms include chills, congestion, coughing, fatigue, a fever, headaches, myalgias and a sore throat. Pertinent negatives include no abdominal pain, anorexia, arthralgias, change in bowel habit, chest pain, diaphoresis, joint swelling, nausea, neck pain, numbness, rash, swollen glands, urinary symptoms, vertigo, visual change, vomiting or weakness. Nothing aggravates the symptoms. She has tried nothing for the symptoms.       Review of Systems   Constitutional: Positive for appetite change, chills, fatigue and fever. Negative for activity change and diaphoresis.   HENT: Positive for congestion, postnasal drip, rhinorrhea, sneezing and sore throat. Negative for voice change.    Respiratory: Positive for cough. Negative for chest tightness and wheezing.    Cardiovascular: Negative for chest pain.   Gastrointestinal: Negative for abdominal pain, anorexia, change in bowel habit, nausea and vomiting.   Musculoskeletal: Positive for myalgias. Negative for arthralgias, joint swelling and neck pain.   Skin: Negative for rash.   Neurological: Positive for headaches. Negative for dizziness, vertigo, weakness and numbness.       History:  History reviewed. No pertinent past medical history.    Past Surgical History:   Procedure Laterality Date    APPENDECTOMY         Family History   Problem Relation Age of Onset    No known problems Father        Social History     Tobacco Use     Smoking status: Never Smoker    Smokeless tobacco: Never Used   Substance Use Topics    Alcohol use: Never     Frequency: Never    Drug use: Never       History reviewed.        Current Outpatient Medications:     vitamin D, ergocalciferol, (DRISDOL) 50000 UNIT Cap, TK 1 C PO Q WK, Disp: , Rfl: 0    oseltamivir (TAMIFLU) 75 MG capsule, Take 1 capsule (75 mg total) by mouth 2 (two) times daily for 5 days, Disp: 10 capsule, Rfl: 0    No Known Allergies    Medications and Allergies reviewed.    PHYSICAL EXAM     Vitals:    04/22/18 1517   BP: 105/72   Pulse: 83   Resp: 18   Temp: 98.6 F (37 C)   TempSrc: Tympanic   SpO2: 98%   Weight: 74.4 kg (164 lb)   Height: 1.651 m (5\' 5" )       Physical Exam   Nursing note and vitals reviewed.  Constitutional: She appears well-developed and well-nourished. No distress.   HENT:   Right Ear: Tympanic membrane normal.   Left Ear: Tympanic membrane normal.   Nose: Mucosal edema present.   Mouth/Throat: Uvula is midline, oropharynx is clear  and moist and mucous membranes are normal.   Eyes: Conjunctivae are normal.   Neck: Neck supple.   Cardiovascular: Normal rate and regular rhythm.   Pulmonary/Chest: Effort normal and breath sounds normal. No respiratory distress.   Lymphadenopathy:     She has no cervical adenopathy.   Neurological: She is alert.   Skin: Skin is warm.   Psychiatric: She has a normal mood and affect.         UCC COURSE       Results     Procedure Component Value Units Date/Time    Influenza A/B [[id number]]  (Abnormal) Collected:  04/22/18 1526     Updated:  04/22/18 1531     POCT QC Pass     POCT Rapid Influenza A AG Negative     POCT Rapid Influenza B AG Positive            No results found.      Orders Placed This Encounter   Medications    oseltamivir (TAMIFLU) 75 MG capsule     Sig: Take 1 capsule (75 mg total) by mouth 2 (two) times daily for 5 days     Dispense:  10 capsule     Refill:  0         PROCEDURES     Procedures       ASSESSMENT     Encounter  Diagnoses   Name Primary?    Flu-like symptoms     Influenza Yes          SSESSMENT    PLAN     Tamiflu for 5 days, Flonase Nasal spray.   Push plenty of fluids  Rest  Hand hygiene  NSAIDs PRN for the fever, sinus pressure , headache.  RTC if there are any new symptoms as discussed in the office   AVS provided.        Discussed results and diagnosis with patient/family.  Reviewed warning signs for worsening condition, as well as, indications for follow-up with pmd and return to urgent care clinic.   Patient/family expressed understanding of instructions.    Orders Placed This Encounter   Procedures    Influenza A/B    Rapid Group A Strep         An After Visit Summary was printed and given to the patient.      Signed,  Erastus Bartolomei Hardie Pulley, MD

## 2018-04-22 NOTE — Patient Instructions (Signed)
The Flu (Influenza)     The virus that causes the flu spreads through the air in droplets when someone who has the flu coughs, sneezes, laughs, or talks.   The flu (influenza) is an infection that affects your respiratory tract. This tract is made up of your mouth, nose, and lungs, and the passages between them. Unlike a cold, the flu can make you very ill. And it can lead to pneumonia, a serious lung infection. The flu can have serious complications and even cause death.  Who is at risk for the flu?  Anyone can get the flu. But you are more likely to become infected if you:   Have a weakened immune system   Work in a healthcare setting where you may be exposed to flu germs   Live or work with someone who has the flu   Haven't had an annual flu shot  How does the flu spread?  The flu is caused byavirus. The virusspreads through the air in droplets when someone who has the flu coughs, sneezes, laughs, or talks. You can become infected when you inhale theseviruses directly. You can also become infected when you touch a surface on which the droplets have landed and then transfer the germs to your eyes, nose, or mouth. Touching used tissues, or sharing utensils, drinking glasses, or a toothbrush from an infected person can expose you to flu viruses, too.  What are the symptoms of the flu?  Flu symptoms tend to come on quickly and may last a few days to a few weeks. They include:   Fever usually higher than 100.4F (38C)and chills   Sore throat and headache   Dry cough   Runny nose   Tiredness and weakness   Muscle aches  Who is at risk for flu complications?  For some people, the flu can be very serious. The risk for complications is greater for:   Children younger thanage 5   Adultsages 65and older   People with a chronic illness such as diabetes or heart, kidney, or lung disease   People who live in a nursing home or long-term care facility  How is the flu treated?  The flu usually gets  better after 7 days or so. In some cases, yourhealthcare providermay prescribe an antiviral medicine. This may help you get well a little sooner. For the medicine to help, you need to take it as soon as possible (ideally within 48 hours)after your symptoms start. If you develop pneumonia or other serious illness, you may need to stay in the hospital.  Easing flu symptoms   Drink lots of fluids such as water, juice, and warm soup. A good rule is to drink enough so that you urinate your normal amount.   Get plenty of rest.   Ask yourhealthcare providerwhat to takefor fever and pain.   Call yourprovider if your fever is 100.4F (38C) or higher, or you become dizzy, lightheaded, or short of breath.  Takingsteps to protect others   Wash your hands often, especially after coughing or sneezing. Or clean your hands with an alcohol-based handcleaner containing at least 60% alcohol.   Cough or sneeze into a tissue. Then throw the tissue away and wash your hands. If you don't have a tissue, cough and sneeze into your elbow.   Stay home untilat least 24 hours after you no longer have a fever or chills. Be sure the fever isn't being hidden by fever-reducing medicine.   Don't share food, utensils, drinking   glasses, or a toothbrush with others.   Ask yourhealthcare provider ifothers in your household should get antiviral medicine to help them preventinfection.  How can the flu be prevented?   One of the best ways to prevent the flu is to get a flu vaccine each year. The CDC recommends that all people 6 months of age and older get a flu vaccine every year. The virus that causes the flu changes from year to year. For that reason, healthcare providers advise getting the flu vaccine each year as soon as it's available in your area. The vaccineisusually given as a shot, which is usually the first choice of experts. Other forms like a nasal spray or needle-free vaccine are available for some people.  Yourhealthcare providercan tell you which vaccine is right for you.   Wash your hands often. Frequent handwashing is a proven way to help prevent infection.   Carry an alcohol-based hand gel containing at least 60% alcohol. Use it when you can't use soap and water. Then wash your hands as soon as you can.   Try not to touch your eyes, nose, or mouth.   At home and work, clean phones, computer keyboards, and toys often with disinfectant wipes.   If possible, don't have close contact with others who have the flu or symptoms of the flu.  Handwashing tips  Handwashing is one of the best ways to prevent many common infections. If you are caring for or visiting someone with the flu, wash your hands each time you enter and leave the room. Follow these steps:   Use warm water and plenty of soap. Rub your hands together well.   Clean the whole hand, including under your nails, between your fingers, and up the wrists.   Wash for at least 15seconds.   Rinse, letting the water run down your fingers, not up your wrists.   Dry your hands well. Use a paper towel to turn off the faucet and open the door.  Using alcohol-based hand cleaners  Alcohol-based handcleaners are also a good choice. Use them when you can't use soap and water. Follow these steps:   Squeeze about a tablespoon of gel into the palm of one hand.   Rub your hands together briskly, cleaning the backs of your hands, the palms, between your fingers, and up the wrists.   Rub until the gel is gone and your hands are completely dry.  Preventing the flu in healthcare settings  The flu is a special concern for people in hospitals and long-term care facilities. To help prevent the spread of flu, many hospitals and nursing homes take these steps:   Healthcare providers wash their hands or use an alcohol-based hand cleaner before and after treating each patient.   People with the flu have private rooms and bathrooms or share a room with someone with the  same infection.   People who are at high risk for the flu but don't have it are encouraged to get theflu and pneumonia vaccines.   All healthcare workers are encouraged or required toget flu shots.  [employer] last reviewed this educational content on 03/18/2015   2000-2019 [employer] [address], [place] [address]. All rights reserved. This information is not intended as a substitute for professional medical care. Always follow your healthcare professional's instructions.
